# Patient Record
Sex: Male | Born: 1990 | Race: Black or African American | Hispanic: No | Marital: Single | Smoking: Current every day smoker
Health system: Southern US, Community
[De-identification: ages and names within clinical notes are randomized; demographics above are authoritative.]

## PROBLEM LIST (undated history)

## (undated) DIAGNOSIS — B20 Human immunodeficiency virus [HIV] disease: Secondary | ICD-10-CM

## (undated) HISTORY — DX: Human immunodeficiency virus (HIV) disease: B20

---
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
## 2010-02-19 ENCOUNTER — Ambulatory Visit: Payer: Self-pay | Admitting: Internal Medicine

## 2010-02-28 ENCOUNTER — Emergency Department (HOSPITAL_COMMUNITY)
Admission: EM | Admit: 2010-02-28 | Discharge: 2010-02-28 | Payer: Self-pay | Source: Home / Self Care | Admitting: Emergency Medicine

## 2010-03-09 ENCOUNTER — Emergency Department (HOSPITAL_COMMUNITY)
Admission: EM | Admit: 2010-03-09 | Discharge: 2010-03-09 | Payer: Self-pay | Source: Home / Self Care | Admitting: Emergency Medicine

## 2010-03-17 LAB — DIFFERENTIAL
Basophils Absolute: 0 10*3/uL (ref 0.0–0.1)
Basophils Relative: 0 % (ref 0–1)
Eosinophils Absolute: 0.1 10*3/uL (ref 0.0–0.7)
Eosinophils Relative: 1 % (ref 0–5)
Lymphocytes Relative: 24 % (ref 12–46)
Lymphs Abs: 1.9 10*3/uL (ref 0.7–4.0)
Monocytes Absolute: 0.7 10*3/uL (ref 0.1–1.0)
Monocytes Relative: 8 % (ref 3–12)
Neutro Abs: 5.5 10*3/uL (ref 1.7–7.7)
Neutrophils Relative %: 67 % (ref 43–77)

## 2010-03-17 LAB — CBC
HCT: 43.4 % (ref 39.0–52.0)
Hemoglobin: 15.4 g/dL (ref 13.0–17.0)
MCH: 30.5 pg (ref 26.0–34.0)
MCHC: 35.5 g/dL (ref 30.0–36.0)
MCV: 85.9 fL (ref 78.0–100.0)
Platelets: 297 10*3/uL (ref 150–400)
RBC: 5.05 MIL/uL (ref 4.22–5.81)
RDW: 12.2 % (ref 11.5–15.5)
WBC: 8.3 10*3/uL (ref 4.0–10.5)

## 2010-03-17 LAB — URINALYSIS, ROUTINE W REFLEX MICROSCOPIC
Bilirubin Urine: NEGATIVE
Ketones, ur: NEGATIVE mg/dL
Leukocytes, UA: NEGATIVE
Nitrite: NEGATIVE
Protein, ur: NEGATIVE mg/dL
Specific Gravity, Urine: 1.018 (ref 1.005–1.030)
Urine Glucose, Fasting: NEGATIVE mg/dL
Urobilinogen, UA: 1 mg/dL (ref 0.0–1.0)
pH: 7 (ref 5.0–8.0)

## 2010-03-17 LAB — COMPREHENSIVE METABOLIC PANEL
ALT: 14 U/L (ref 0–53)
AST: 19 U/L (ref 0–37)
Albumin: 4.3 g/dL (ref 3.5–5.2)
Alkaline Phosphatase: 55 U/L (ref 39–117)
BUN: 6 mg/dL (ref 6–23)
CO2: 27 mEq/L (ref 19–32)
Calcium: 9.4 mg/dL (ref 8.4–10.5)
Chloride: 102 mEq/L (ref 96–112)
Creatinine, Ser: 1 mg/dL (ref 0.4–1.5)
GFR calc Af Amer: >60 mL/min (ref >60)
GFR calc non Af Amer: >60 mL/min (ref >60)
Glucose, Bld: 112 mg/dL — ABNORMAL HIGH (ref 70–99)
Potassium: 3.5 mEq/L (ref 3.5–5.1)
Sodium: 137 mEq/L (ref 135–145)
Total Bilirubin: 0.8 mg/dL (ref 0.3–1.2)
Total Protein: 7.2 g/dL (ref 6.0–8.3)

## 2010-03-17 LAB — URINE MICROSCOPIC-ADD ON

## 2010-03-17 LAB — OCCULT BLOOD, POC DEVICE: Fecal Occult Bld: NEGATIVE

## 2010-03-17 LAB — LIPASE, BLOOD: Lipase: 20 U/L (ref 11–59)

## 2010-05-04 ENCOUNTER — Emergency Department (HOSPITAL_COMMUNITY)
Admission: EM | Admit: 2010-05-04 | Discharge: 2010-05-04 | Disposition: A | Discharge status: Home / Self Care | Payer: 59 | Attending: Emergency Medicine | Admitting: Emergency Medicine

## 2010-05-04 DIAGNOSIS — G479 Sleep disorder, unspecified: Secondary | ICD-10-CM | POA: Insufficient documentation

## 2010-05-04 DIAGNOSIS — Z711 Person with feared health complaint in whom no diagnosis is made: Secondary | ICD-10-CM | POA: Insufficient documentation

## 2010-05-04 DIAGNOSIS — F17200 Nicotine dependence, unspecified, uncomplicated: Secondary | ICD-10-CM | POA: Insufficient documentation

## 2010-05-18 LAB — DIFFERENTIAL
Basophils Relative: 1 % (ref 0–1)
Eosinophils Absolute: 0.1 10*3/uL (ref 0.0–0.7)
Eosinophils Relative: 2 % (ref 0–5)
Lymphs Abs: 1.9 10*3/uL (ref 0.7–4.0)
Monocytes Absolute: 0.4 10*3/uL (ref 0.1–1.0)
Monocytes Relative: 5 % (ref 3–12)

## 2010-05-18 LAB — COMPREHENSIVE METABOLIC PANEL
ALT: 22 U/L (ref 0–53)
AST: 26 U/L (ref 0–37)
Albumin: 4.6 g/dL (ref 3.5–5.2)
Alkaline Phosphatase: 60 U/L (ref 39–117)
BUN: 6 mg/dL (ref 6–23)
CO2: 26 mEq/L (ref 19–32)
Calcium: 9.6 mg/dL (ref 8.4–10.5)
Chloride: 99 mEq/L (ref 96–112)
Creatinine, Ser: 0.97 mg/dL (ref 0.4–1.5)
GFR calc Af Amer: >60 mL/min (ref >60)
GFR calc non Af Amer: >60 mL/min (ref >60)
Glucose, Bld: 107 mg/dL — ABNORMAL HIGH (ref 70–99)
Potassium: 4.4 mEq/L (ref 3.5–5.1)
Sodium: 134 mEq/L — ABNORMAL LOW (ref 135–145)
Total Bilirubin: 1.1 mg/dL (ref 0.3–1.2)
Total Protein: 7.4 g/dL (ref 6.0–8.3)

## 2010-05-18 LAB — CBC
HCT: 45.1 % (ref 39.0–52.0)
Hemoglobin: 14.6 g/dL (ref 13.0–17.0)
MCHC: 32.5 g/dL (ref 30.0–36.0)
MCV: 78.1 fL (ref 78.0–100.0)
Platelets: 364 10*3/uL (ref 150–400)
RBC: 5.78 MIL/uL (ref 4.22–5.81)
RDW: 16.1 % — ABNORMAL HIGH (ref 11.5–15.5)
WBC: 7.7 10*3/uL (ref 4.0–10.5)

## 2010-11-16 ENCOUNTER — Emergency Department (HOSPITAL_COMMUNITY)
Admission: EM | Admit: 2010-11-16 | Discharge: 2010-11-16 | Disposition: A | Discharge status: Home / Self Care | Payer: Self-pay | Attending: Emergency Medicine | Admitting: Emergency Medicine

## 2010-11-16 ENCOUNTER — Emergency Department (HOSPITAL_COMMUNITY): Payer: Self-pay

## 2010-11-16 DIAGNOSIS — K5900 Constipation, unspecified: Secondary | ICD-10-CM | POA: Insufficient documentation

## 2010-11-16 DIAGNOSIS — Z87891 Personal history of nicotine dependence: Secondary | ICD-10-CM | POA: Insufficient documentation

## 2010-11-16 DIAGNOSIS — K644 Residual hemorrhoidal skin tags: Secondary | ICD-10-CM | POA: Insufficient documentation

## 2010-12-02 LAB — CBC
HCT: 31.8 — ABNORMAL LOW
Hemoglobin: 10.5 — ABNORMAL LOW
MCV: 88.2
RDW: 14.3

## 2010-12-02 LAB — BASIC METABOLIC PANEL
Chloride: 105
Glucose, Bld: 88
Potassium: 4
Sodium: 138

## 2010-12-02 LAB — DIFFERENTIAL
Basophils Absolute: 0.1
Eosinophils Relative: 3
Lymphocytes Relative: 43
Lymphs Abs: 3
Monocytes Absolute: 0.4

## 2010-12-02 LAB — URINALYSIS, ROUTINE W REFLEX MICROSCOPIC
Bilirubin Urine: NEGATIVE
Glucose, UA: NEGATIVE
Ketones, ur: NEGATIVE
Leukocytes, UA: NEGATIVE
pH: 7.5

## 2011-03-28 ENCOUNTER — Emergency Department (HOSPITAL_BASED_OUTPATIENT_CLINIC_OR_DEPARTMENT_OTHER)
Admission: EM | Admit: 2011-03-28 | Discharge: 2011-03-28 | Disposition: A | Discharge status: Home / Self Care | Payer: 59 | Attending: Emergency Medicine | Admitting: Emergency Medicine

## 2011-03-28 ENCOUNTER — Encounter (HOSPITAL_BASED_OUTPATIENT_CLINIC_OR_DEPARTMENT_OTHER): Payer: Self-pay | Admitting: *Deleted

## 2011-03-28 DIAGNOSIS — K089 Disorder of teeth and supporting structures, unspecified: Secondary | ICD-10-CM | POA: Insufficient documentation

## 2011-03-28 DIAGNOSIS — K0889 Other specified disorders of teeth and supporting structures: Secondary | ICD-10-CM

## 2011-03-28 MED ORDER — NAPROXEN 500 MG PO TABS: 500.0000 mg | ORAL_TABLET | Freq: Two times a day (BID) | ORAL | Status: AC

## 2011-03-28 MED ORDER — HYDROCODONE-ACETAMINOPHEN 5-325 MG PO TABS
1.0000 | ORAL_TABLET | Freq: Once | ORAL | Status: AC
  Administered 2011-03-28: 1 via ORAL

## 2011-03-28 MED ORDER — HYDROCODONE-ACETAMINOPHEN 5-325 MG PO TABS
ORAL_TABLET | ORAL | Status: AC
  Administered 2011-03-28: 1 via ORAL
  Filled 2011-03-28: qty 1

## 2011-03-28 MED ORDER — PENICILLIN V POTASSIUM 500 MG PO TABS: 500.0000 mg | ORAL_TABLET | Freq: Four times a day (QID) | ORAL | Status: AC

## 2011-03-28 NOTE — ED Notes (Signed)
Pt presents to ED today with c/o dental pain and swelling r/t wisdom teeth extraction.  Pt states called dentist and was advised to come to ER.

## 2011-03-28 NOTE — ED Provider Notes (Signed)
History     CSN: [id number]  Arrival date & time 03/28/11  0234   First MD Initiated Contact with Patient 03/28/11 0244      Chief Complaint  Patient presents with  . Dental Pain    (Consider location/radiation/quality/duration/timing/severity/associated sxs/prior treatment) HPI  patient states he has been having pain at the site of his wisdom tooth extraction that was performed approximately a week ago. Patient states he is sore bilaterally. He has not had any fevers, vomiting. Discomfort increases with chewing. It is moderate. He called his dentist today and was told to come to emergency room for possible antibiotics and medications for pain. History reviewed. No pertinent past medical history.  History reviewed. No pertinent past surgical history.  History reviewed. No pertinent family history.  History  Substance Use Topics  . Smoking status: Not on file  . Smokeless tobacco: Not on file  . Alcohol Use: Not on file      Review of Systems  All other systems reviewed and are negative.    Allergies  Review of patient's allergies indicates no known allergies.  Home Medications   Current Outpatient Rx  Name Route Sig Dispense Refill  . NAPROXEN 500 MG PO TABS Oral Take 1 tablet (500 mg total) by mouth 2 (two) times daily with a meal. As needed for pain 20 tablet 0  . PENICILLIN V POTASSIUM 500 MG PO TABS Oral Take 1 tablet (500 mg total) by mouth 4 (four) times daily. 40 tablet 0    BP 144/93  Pulse 68  Temp 97.7 F (36.5 C)  Resp 20  SpO2 98%  Physical Exam  Nursing note and vitals reviewed. Constitutional: He appears well-developed and well-nourished. No distress.  HENT:  Head: Normocephalic and atraumatic.  Right Ear: External ear normal.  Left Ear: External ear normal.       Status post bilateral lower wisdom tooth extraction, no drainage or discharge no erythema  Eyes: Conjunctivae are normal. Right eye exhibits no discharge. Left eye exhibits no  discharge. No scleral icterus.  Neck: Neck supple. No tracheal deviation present.  Cardiovascular: Normal rate.   Pulmonary/Chest: Effort normal. No stridor. No respiratory distress.  Musculoskeletal: He exhibits no edema.  Lymphadenopathy:    He has no cervical adenopathy.  Neurological: He is alert. Cranial nerve deficit: no gross deficits.  Skin: Skin is warm and dry. No rash noted.  Psychiatric: He has a normal mood and affect.    ED Course  Procedures (including critical care time)  Labs Reviewed - No data to display No results found.   1. Toothache       MDM  Patient without obvious signs of severe infection. We'll prescribe him a course of penicillin and Naprosyn and he is encouraged to follow up with his dentist this coming week.        Celene Kras, MD 03/28/11 [phone]

## 2011-08-28 ENCOUNTER — Encounter (HOSPITAL_COMMUNITY): Payer: Self-pay

## 2011-08-28 ENCOUNTER — Emergency Department (INDEPENDENT_AMBULATORY_CARE_PROVIDER_SITE_OTHER)
Admission: EM | Admit: 2011-08-28 | Discharge: 2011-08-28 | Disposition: A | Discharge status: Home / Self Care | Payer: Medicaid Other | Source: Home / Self Care | Attending: Family Medicine | Admitting: Family Medicine

## 2011-08-28 DIAGNOSIS — J020 Streptococcal pharyngitis: Secondary | ICD-10-CM

## 2011-08-28 MED ORDER — CEFDINIR 300 MG PO CAPS: 300.0000 mg | ORAL_CAPSULE | Freq: Two times a day (BID) | ORAL | Status: AC

## 2011-08-28 NOTE — ED Provider Notes (Signed)
History     CSN: [id number]  Arrival date & time 08/28/11  1520   First MD Initiated Contact with Patient 08/28/11 1546      No chief complaint on file.   (Consider location/radiation/quality/duration/timing/severity/associated sxs/prior treatment) Patient is a 21 y.o. male presenting with pharyngitis. The history is provided by the patient.  Sore Throat This is a new problem. The current episode started 2 days ago. The problem has been gradually worsening. The symptoms are aggravated by swallowing.    No past medical history on file.  No past surgical history on file.  No family history on file.  History  Substance Use Topics  . Smoking status: Not on file  . Smokeless tobacco: Not on file  . Alcohol Use: Not on file      Review of Systems  Constitutional: Negative for fever and chills.  HENT: Positive for sore throat. Negative for congestion, rhinorrhea, postnasal drip and sinus pressure.   Respiratory: Negative for cough.   Skin: Negative.     Allergies  Review of patient's allergies indicates no known allergies.  Home Medications   Current Outpatient Rx  Name Route Sig Dispense Refill  . CEFDINIR 300 MG PO CAPS Oral Take 1 capsule (300 mg total) by mouth 2 (two) times daily. 20 capsule 0  . NAPROXEN 500 MG PO TABS Oral Take 1 tablet (500 mg total) by mouth 2 (two) times daily with a meal. As needed for pain 20 tablet 0    BP 153/94  Pulse 78  Temp 98.5 F (36.9 C) (Oral)  Resp 16  SpO2 99%  Physical Exam  Nursing note and vitals reviewed. Constitutional: He is oriented to person, place, and time. He appears well-developed and well-nourished.  HENT:  Head: Normocephalic.  Right Ear: External ear normal.  Left Ear: External ear normal.  Mouth/Throat: Oropharyngeal exudate and posterior oropharyngeal erythema present.  Neck: Normal range of motion. Neck supple.  Lymphadenopathy:    He has cervical adenopathy.  Neurological: He is alert and  oriented to person, place, and time.  Skin: Skin is warm and dry. No rash noted.    ED Course  Procedures (including critical care time)   Labs Reviewed  POCT RAPID STREP A ([facility] CARE ONLY)   No results found.   1. Strep sore throat       MDM          Linna Hoff, MD 08/28/11 1556

## 2011-08-28 NOTE — ED Notes (Signed)
Swollen tonsils, sore throat; NAD

## 2011-08-28 NOTE — Discharge Instructions (Signed)
Drink lots of fluids, take all of medicine, use lozenges as needed.return if needed °

## 2013-03-13 ENCOUNTER — Emergency Department (HOSPITAL_COMMUNITY)
Admission: EM | Admit: 2013-03-13 | Discharge: 2013-03-13 | Discharge status: Absent without leave | Payer: Self-pay | Source: Home / Self Care

## 2013-03-13 ENCOUNTER — Emergency Department (HOSPITAL_COMMUNITY)
Admission: EM | Admit: 2013-03-13 | Discharge: 2013-03-13 | Discharge status: Against Medical Advice | Payer: Self-pay | Attending: Emergency Medicine | Admitting: Emergency Medicine

## 2013-03-13 ENCOUNTER — Encounter (HOSPITAL_COMMUNITY): Payer: Self-pay | Admitting: Emergency Medicine

## 2013-03-13 DIAGNOSIS — R059 Cough, unspecified: Secondary | ICD-10-CM | POA: Insufficient documentation

## 2013-03-13 DIAGNOSIS — G4730 Sleep apnea, unspecified: Secondary | ICD-10-CM | POA: Insufficient documentation

## 2013-03-13 DIAGNOSIS — J029 Acute pharyngitis, unspecified: Secondary | ICD-10-CM | POA: Insufficient documentation

## 2013-03-13 DIAGNOSIS — F17200 Nicotine dependence, unspecified, uncomplicated: Secondary | ICD-10-CM | POA: Insufficient documentation

## 2013-03-13 DIAGNOSIS — R05 Cough: Secondary | ICD-10-CM | POA: Insufficient documentation

## 2013-03-13 LAB — RAPID STREP SCREEN (MED CTR MEBANE ONLY): STREPTOCOCCUS, GROUP A SCREEN (DIRECT): NEGATIVE

## 2013-03-13 NOTE — ED Notes (Signed)
Pt is here with sore throat and reports that he is waking up coughing at night.  Pt was told to come here because they think he could have sleep apnea per family, who says he stops breathing some during the night.

## 2013-03-15 LAB — CULTURE, GROUP A STREP

## 2013-05-27 ENCOUNTER — Emergency Department (HOSPITAL_BASED_OUTPATIENT_CLINIC_OR_DEPARTMENT_OTHER)
Admission: EM | Admit: 2013-05-27 | Discharge: 2013-05-27 | Disposition: A | Discharge status: Home / Self Care | Payer: Self-pay | Attending: Emergency Medicine | Admitting: Emergency Medicine

## 2013-05-27 ENCOUNTER — Emergency Department (HOSPITAL_BASED_OUTPATIENT_CLINIC_OR_DEPARTMENT_OTHER): Payer: Self-pay

## 2013-05-27 DIAGNOSIS — R869 Unspecified abnormal finding in specimens from male genital organs: Secondary | ICD-10-CM | POA: Insufficient documentation

## 2013-05-27 DIAGNOSIS — M25519 Pain in unspecified shoulder: Secondary | ICD-10-CM | POA: Insufficient documentation

## 2013-05-27 DIAGNOSIS — F17200 Nicotine dependence, unspecified, uncomplicated: Secondary | ICD-10-CM | POA: Insufficient documentation

## 2013-05-27 LAB — HIV ANTIBODY (ROUTINE TESTING W REFLEX): HIV: REACTIVE — AB

## 2013-05-27 MED ORDER — IBUPROFEN 600 MG PO TABS: 600.0000 mg | ORAL_TABLET | Freq: Four times a day (QID) | ORAL | Status: AC | PRN

## 2013-05-27 NOTE — Discharge Instructions (Signed)
Rotator Cuff Tendinitis  Rotator cuff tendinitis is inflammation of the tough, cord-like bands that connect muscle to bone (tendons) in your rotator cuff. Your rotator cuff is the collection of all the muscles and tendons that connect your arm to your shoulder. Your rotator cuff holds the head of your upper arm bone (humerus) in the cup (fossa) of your shoulder blade (scapula). CAUSES Rotator cuff tendinitis is usually caused by overusing the joint involved.  SIGNS AND SYMPTOMS  Deep ache in the shoulder also felt on the outside upper arm over the shoulder muscle.  Point tenderness over the area that is injured.  Pain comes on gradually and becomes worse with lifting the arm to the side (abduction) or turning it inward (internal rotation).  May lead to a chronic tear: When a rotator cuff tendon becomes inflamed, it runs the risk of losing its blood supply, causing some tendon fibers to die. This increases the risk that the tendon can fray and partially or completely tear. DIAGNOSIS Rotator cuff tendinitis is diagnosed by taking a medical history, performing a physical exam, and reviewing results of imaging exams. The medical history is useful to help determine the type of rotator cuff injury. The physical exam will include looking at the injured shoulder, feeling the injured area, and watching you do range-of-motion exercises. X-ray exams are typically done to rule out other causes of shoulder pain, such as fractures. MRI is the imaging exam usually used for significant shoulder injuries. Sometimes a dye study called CT arthrogram is done, but it is not as widely used as MRI. In some institutions, special ultrasound tests may also be used to aid in the diagnosis. TREATMENT  Less Severe Cases  Use of a sling to rest the shoulder for a short period of time. Prolonged use of the sling can cause stiffness, weakness, and loss of motion of the shoulder joint.  Anti-inflammatory medicines, such as  ibuprofen or naproxen sodium, may be prescribed. More Severe Cases  Physical therapy.  Use of steroid injections into the shoulder joint.  Surgery. HOME CARE INSTRUCTIONS   Use a sling or splint until the pain decreases. Prolonged use of the sling can cause stiffness, weakness, and loss of motion of the shoulder joint.  Apply ice to the injured area:  Put ice in a plastic bag.  Place a towel between your skin and the bag.  Leave the ice on for 20 minutes, 2 3 times a day.  Try to avoid use other than gentle range of motion while your shoulder is painful. Use the shoulder and exercise only as directed by your health care provider. Stop exercises or range of motion if pain or discomfort increases, unless directed otherwise by your health care provider.  Only take over-the-counter or prescription medicines for pain, discomfort, or fever as directed by your health care provider.  If you were given a shoulder sling and straps (immobilizer), do not remove it except as directed, or until you see a health care provider for a follow-up exam. If you need to remove it, move your arm as little as possible or as directed.  You may want to sleep on several pillows at night to lessen swelling and pain. SEEK IMMEDIATE MEDICAL CARE IF:   Your shoulder pain increases or new pain develops in your arm, hand, or fingers and is not relieved with medicines.  You have new, unexplained symptoms, especially increased numbness in the hands or loss of strength.  You develop any worsening of the   problems that brought you in for care.  Your arm, hand, or fingers are numb or tingling.  Your arm, hand, or fingers are swollen, painful, or turn white or blue. MAKE SURE YOU:  Understand these instructions.  Will watch your condition.  Will get help right away if you are not doing well or get worse. Document Released: 05/09/2003 Document Revised: 12/07/2012 Document Reviewed: 09/28/2012 Exit[employer] Patient  Information 2014 [employer].  

## 2013-05-27 NOTE — ED Notes (Signed)
Pt. Reports he has L shoulder pain since Jan. 2015 and also reports "My semen is yellow"  No c/o testicular pain or penile pain.  No injury to the L shoulder per Pt.

## 2013-05-27 NOTE — ED Provider Notes (Signed)
CSN: [id number]     Arrival date & time 05/27/13  0004 History   First MD Initiated Contact with Patient 05/27/13 0041     Chief Complaint  Patient presents with  . Shoulder Pain     (Consider location/radiation/quality/duration/timing/severity/associated sxs/prior Treatment) HPI Comments: Pt comes in with cc of left shoulder pain and "yellow semen." Shoulder pain started in Jan. Daily, constant mild pain - worse with certain activities. He moved from [place] in Jan. Pt also complains of dark yellow semen the last few days, with may be some red stuff mixed in, which he thinks is blood. He has no UTI like sx, no trauma, no rash, no scrotal pain, and no yellow discharge. Pt is having male to male intercourse, with 1 partner, unprotected, denies any hx of STD.  Patient is a 23 y.o. male presenting with shoulder pain. The history is provided by the patient.  Shoulder Pain Pertinent negatives include no chest pain and no abdominal pain.    No past medical history on file. No past surgical history on file. No family history on file. History  Substance Use Topics  . Smoking status: Current Every Day Smoker  . Smokeless tobacco: Not on file  . Alcohol Use: Yes    Review of Systems  Cardiovascular: Negative for chest pain.  Gastrointestinal: Negative for nausea, vomiting and abdominal pain.  Genitourinary: Negative for dysuria, flank pain, discharge, penile swelling, scrotal swelling, penile pain and testicular pain.  Musculoskeletal: Positive for arthralgias.  Skin: Negative for rash.      Allergies  Review of patient's allergies indicates no known allergies.  Home Medications   Current Outpatient Rx  Name  Route  Sig  Dispense  Refill  . ibuprofen (ADVIL,MOTRIN) 600 MG tablet   Oral   Take 1 tablet (600 mg total) by mouth every 6 (six) hours as needed.   30 tablet   0    BP 141/98  Pulse 79  Temp(Src) 98.9 F (37.2 C) (Oral)  Resp 17  Ht 5\' 10"  (1.778 m)  Wt 148 lb  (67.132 kg)  BMI 21.24 kg/m2 Physical Exam  Nursing note and vitals reviewed. Constitutional: He is oriented to person, place, and time. He appears well-developed.  HENT:  Head: Atraumatic.  Eyes: Conjunctivae are normal.  Neck: Neck supple.  Pulmonary/Chest: Effort normal.  Musculoskeletal:  Pt has tenderness with abduction (at 60 degrees) of the shoulder, and with internal rotation of the shoulder. No joint swelling appreciated.   Neurological: He is alert and oriented to person, place, and time.    ED Course  Procedures (including critical care time) Labs Review Labs Reviewed  GC/CHLAMYDIA PROBE AMP  HIV ANTIBODY (ROUTINE TESTING)   Imaging Review Dg Shoulder Left  05/27/2013   CLINICAL DATA:  Evaluate for rotator cuff tendinitis  EXAM: LEFT SHOULDER - 2+ VIEW  COMPARISON:  10/02/2009  FINDINGS: There is no evidence of fracture or dislocation. There is no evidence of arthropathy or other focal bone abnormality. No evidence of calcific tendinitis.  IMPRESSION: Negative.   Electronically Signed   By: Tiburcio Pea M.D.   On: 05/27/2013 01:31     EKG Interpretation None      MDM   Final diagnoses:  Shoulder pain    Pt comes in with 2 complains.  Shoulder pain x months. ? Rotator cuff injury. Will give Sports medicine f/u. Also has abnormal color of semen. No discharge, no hard signs of STD. Will get urine GC and Chlamydia testing,  and patient informed to see PCP for this complains if the sx persists.   Derwood KaplanAnkit Aaiden Depoy, MD 05/27/13 [phone]

## 2013-05-29 ENCOUNTER — Telehealth: Payer: Self-pay | Admitting: Infectious Diseases

## 2013-05-29 ENCOUNTER — Telehealth (HOSPITAL_BASED_OUTPATIENT_CLINIC_OR_DEPARTMENT_OTHER): Payer: Self-pay | Admitting: Emergency Medicine

## 2013-05-29 LAB — GC/CHLAMYDIA PROBE AMP
CT Probe RNA: POSITIVE — AB
GC Probe RNA: NEGATIVE

## 2013-05-29 NOTE — Telephone Encounter (Signed)
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 I called and left message for pt to call back to clinic (did not disclose why).  I have also turned his MR and name over to the DIS.

## 2013-05-30 ENCOUNTER — Encounter: Payer: Self-pay | Admitting: *Deleted

## 2013-05-30 ENCOUNTER — Ambulatory Visit (INDEPENDENT_AMBULATORY_CARE_PROVIDER_SITE_OTHER): Payer: Self-pay

## 2013-05-30 DIAGNOSIS — Z79899 Other long term (current) drug therapy: Secondary | ICD-10-CM

## 2013-05-30 DIAGNOSIS — Z23 Encounter for immunization: Secondary | ICD-10-CM

## 2013-05-30 DIAGNOSIS — B20 Human immunodeficiency virus [HIV] disease: Secondary | ICD-10-CM

## 2013-05-30 DIAGNOSIS — Z113 Encounter for screening for infections with a predominantly sexual mode of transmission: Secondary | ICD-10-CM

## 2013-05-30 DIAGNOSIS — A749 Chlamydial infection, unspecified: Secondary | ICD-10-CM

## 2013-05-30 LAB — CBC WITH DIFFERENTIAL/PLATELET
BASOS ABS: 0 10*3/uL (ref 0.0–0.1)
Basophils Relative: 0 % (ref 0–1)
EOS PCT: 1 % (ref 0–5)
Eosinophils Absolute: 0.1 10*3/uL (ref 0.0–0.7)
HEMATOCRIT: 48.2 % (ref 39.0–52.0)
HEMOGLOBIN: 16.9 g/dL (ref 13.0–17.0)
LYMPHS ABS: 1.7 10*3/uL (ref 0.7–4.0)
LYMPHS PCT: 26 % (ref 12–46)
MCH: 29.9 pg (ref 26.0–34.0)
MCHC: 35.1 g/dL (ref 30.0–36.0)
MCV: 85.3 fL (ref 78.0–100.0)
MONO ABS: 0.5 10*3/uL (ref 0.1–1.0)
MONOS PCT: 7 % (ref 3–12)
NEUTROS ABS: 4.3 10*3/uL (ref 1.7–7.7)
Neutrophils Relative %: 66 % (ref 43–77)
Platelets: 335 10*3/uL (ref 150–400)
RBC: 5.65 MIL/uL (ref 4.22–5.81)
RDW: 13.3 % (ref 11.5–15.5)
WBC: 6.5 10*3/uL (ref 4.0–10.5)

## 2013-05-30 LAB — URINALYSIS
Glucose, UA: NEGATIVE mg/dL
NITRITE: NEGATIVE
PH: 7 (ref 5.0–8.0)
Protein, ur: 30 mg/dL — AB
SPECIFIC GRAVITY, URINE: 1.022 (ref 1.005–1.030)
UROBILINOGEN UA: 4 mg/dL — AB (ref 0.0–1.0)

## 2013-05-30 LAB — COMPLETE METABOLIC PANEL WITH GFR
ALBUMIN: 4.8 g/dL (ref 3.5–5.2)
ALT: 19 U/L (ref 0–53)
AST: 21 U/L (ref 0–37)
Alkaline Phosphatase: 58 U/L (ref 39–117)
BUN: 9 mg/dL (ref 6–23)
CALCIUM: 10 mg/dL (ref 8.4–10.5)
CHLORIDE: 100 meq/L (ref 96–112)
CO2: 26 meq/L (ref 19–32)
CREATININE: 0.95 mg/dL (ref 0.50–1.35)
GFR, Est Non African American: >89 mL/min
GLUCOSE: 126 mg/dL — AB (ref 70–99)
POTASSIUM: 3.8 meq/L (ref 3.5–5.3)
Sodium: 136 mEq/L (ref 135–145)
Total Bilirubin: 0.8 mg/dL (ref 0.2–1.2)
Total Protein: 8.3 g/dL (ref 6.0–8.3)

## 2013-05-30 LAB — LIPID PANEL
CHOLESTEROL: 111 mg/dL (ref 0–200)
HDL: 41 mg/dL (ref >39)
LDL CALC: 59 mg/dL (ref 0–99)
TRIGLYCERIDES: 53 mg/dL (ref <150)
Total CHOL/HDL Ratio: 2.7 Ratio
VLDL: 11 mg/dL (ref 0–40)

## 2013-05-30 LAB — RPR

## 2013-05-30 MED ORDER — AZITHROMYCIN 250 MG PO TABS: ORAL_TABLET | ORAL | Status: DC

## 2013-05-30 NOTE — Progress Notes (Signed)
Patient here today for reactive HIV antibody test and positive chlamydia test performed during visit on 05-27-13 at [facility] emergency room  He will be treated today for chlamydia  with Azithromycin 1 gram.  Test was repeated at today's visit in error.    He is accompanied by his Aunt today who is part of his support system.  He is very anxious and feels his life is over since receiving the test results on 05-29-13. He is not able to process this fully at this time . He has multiple questions about HIV cures and not treatment.  He has been on the Internet and thinks evidence exist that HIV can be cured.  He does not feel the test performed at the Emergency room is accurate and I assured him we will confirm testing with today's labs and most likely this is a positive HIV test.  Patient has at least 6  tattoos most done at tattoo parties.  He has bilateral nipple, left eyebrow, lip and tongue piercings.  Vaccines updated. No records to request.   Laurell Josephsammy K Ines Rebel, RN

## 2013-05-31 LAB — HIV-1 RNA ULTRAQUANT REFLEX TO GENTYP+
HIV 1 RNA QUANT: 23855 {copies}/mL — AB (ref <20)
HIV-1 RNA QUANT, LOG: 4.38 {Log} — AB (ref <1.30)

## 2013-05-31 LAB — URINE CYTOLOGY ANCILLARY ONLY
Chlamydia: POSITIVE — AB
Neisseria Gonorrhea: NEGATIVE

## 2013-05-31 LAB — HEPATITIS C ANTIBODY: HCV AB: NEGATIVE

## 2013-05-31 LAB — HEPATITIS B CORE ANTIBODY, TOTAL: Hep B Core Total Ab: NONREACTIVE

## 2013-05-31 LAB — HEPATITIS A ANTIBODY, TOTAL: HEP A TOTAL AB: NONREACTIVE

## 2013-05-31 LAB — HEPATITIS B SURFACE ANTIBODY,QUALITATIVE

## 2013-05-31 LAB — T-HELPER CELL (CD4) - (RCID CLINIC ONLY)
CD4 % Helper T Cell: 32 % — ABNORMAL LOW (ref 33–55)
CD4 T Cell Abs: 540 /uL (ref 400–2700)

## 2013-05-31 LAB — HEPATITIS B SURFACE ANTIGEN: HEP B S AG: NEGATIVE

## 2013-06-01 LAB — TB SKIN TEST
INDURATION: 0 mm
TB SKIN TEST: NEGATIVE

## 2013-06-05 DIAGNOSIS — B20 Human immunodeficiency virus [HIV] disease: Secondary | ICD-10-CM | POA: Insufficient documentation

## 2013-06-05 DIAGNOSIS — A749 Chlamydial infection, unspecified: Secondary | ICD-10-CM | POA: Insufficient documentation

## 2013-06-05 MED ORDER — AZITHROMYCIN 250 MG PO TABS: ORAL_TABLET | ORAL | Status: DC

## 2013-06-08 LAB — HIV 1/2 CONFIRMATION
HIV 1 ANTIBODY: POSITIVE
HIV-2 Ab: NEGATIVE

## 2013-06-09 LAB — HLA B*5701: HLA-B 5701 W/RFLX HLA-B HIGH: NEGATIVE

## 2013-06-12 LAB — HIV-1 GENOTYPR PLUS

## 2013-06-15 ENCOUNTER — Ambulatory Visit (INDEPENDENT_AMBULATORY_CARE_PROVIDER_SITE_OTHER): Payer: Self-pay | Admitting: Infectious Diseases

## 2013-06-15 ENCOUNTER — Encounter: Payer: Self-pay | Admitting: Infectious Diseases

## 2013-06-15 VITALS — BP 150/105 | HR 85 | Temp 97.7°F | Wt 170.0 lb

## 2013-06-15 DIAGNOSIS — A749 Chlamydial infection, unspecified: Secondary | ICD-10-CM

## 2013-06-15 DIAGNOSIS — B20 Human immunodeficiency virus [HIV] disease: Secondary | ICD-10-CM

## 2013-06-15 MED ORDER — EMTRICITAB-RILPIVIR-TENOFOV DF 200-25-300 MG PO TABS: 1.0000 | ORAL_TABLET | Freq: Every day | ORAL | Status: DC

## 2013-06-15 NOTE — Assessment & Plan Note (Signed)
He was treated with azithro at his intake.

## 2013-06-15 NOTE — Assessment & Plan Note (Addendum)
He has an intact immune system and a relatively low VL. Will get him started on complera, write rx and give it to [facility] counselor. Will see him back in 1-2 months. He is offered/refused condoms. Questions are answered for him and his mother, grandmother.

## 2013-06-15 NOTE — Progress Notes (Signed)
  [facility] - Pharmacist    HPI: Christian Berry is a 23 y.o. male presents with recent diagnosis of HIV in March.  Allergies: No Known Allergies  Vitals: Temp: 97.7 F (36.5 C) (04/16 1525) Temp src: Oral (04/16 1525) BP: 150/105 mmHg (04/16 1525) Pulse Rate: 85 (04/16 1525)  Past Medical History: Past Medical History  Diagnosis Date  . HIV disease     Social History: History   Social History  . Marital Status: Single    Spouse Name: N/A    Number of Children: N/A  . Years of Education: N/A   Social History Main Topics  . Smoking status: Current Every Day Smoker -- 0.40 packs/day for 7 years    Types: Cigarettes  . Smokeless tobacco: Never Used  . Alcohol Use: 0.0 oz/week     Comment: once monthly, social   . Drug Use: No  . Sexual Activity: Yes    Partners: Male    Birth Control/ Protection: Condom   Other Topics Concern  . None   Social History Narrative  . None    Labs: HIV 1 RNA Quant (copies/mL)  Date Value  05/30/2013 [id number]*     CD4 T Cell Abs (/uL)  Date Value  05/30/2013 540      Hep B S Ab (no units)  Date Value  05/30/2013 INDETER*     Hepatitis B Surface Ag (no units)  Date Value  05/30/2013 NEGATIVE      HCV Ab (no units)  Date Value  05/30/2013 NEGATIVE     CrCl: The CrCl is unknown because both a height and weight (above a minimum accepted value) are required for this calculation.  Lipids:    Component Value Date/Time   CHOL 111 05/30/2013 1537   TRIG 53 05/30/2013 1537   HDL 41 05/30/2013 1537   CHOLHDL 2.7 05/30/2013 1537   VLDL 11 05/30/2013 1537   LDLCALC 59 05/30/2013 1537    Assessment: 23 yo M presents today with uncontrolled HIV. Patient is newly diagnosed and otherwise healthy. Patient will start on complera once daily.   Recommendations: I counseled the importance of adherence to the patient and will follow up on compliance with next visit.  Dolphus Jennyadha M [place] Perra, Pharm.D. PGY1 Clinical  Pharmacy Resident [facility] 06/15/2013, 4:03 PM

## 2013-06-15 NOTE — Progress Notes (Signed)
   Subjective:    Patient ID: Christian Berry, male    DOB: [birth year], 23 y.o.   MRN: [id number]  HPI  23 yo M here after dx with HIV+ after STD visit to Ed March 2015. His test at that time was also + for chlamydia.  Has not felt ill at all.   HIV 1 RNA Quant (copies/mL)  Date Value  05/30/2013 [id number]*     CD4 T Cell Abs (/uL)  Date Value  05/30/2013 540     Review of Systems  Constitutional: Negative for fever, chills, appetite change and unexpected weight change.  HENT: Negative for mouth sores and trouble swallowing.   Respiratory: Negative for cough and shortness of breath.   Gastrointestinal: Negative for diarrhea and constipation.  Genitourinary: Negative for difficulty urinating.  Hematological: Negative for adenopathy.       Objective:   Physical Exam  Constitutional: He appears well-developed and well-nourished.  HENT:  Mouth/Throat: No oropharyngeal exudate.  Eyes: EOM are normal. Pupils are equal, round, and reactive to light.  Neck: Neck supple.  Cardiovascular: Normal rate, regular rhythm and normal heart sounds.   Pulmonary/Chest: Effort normal and breath sounds normal.  Abdominal: Soft. Bowel sounds are normal. There is no tenderness.  Musculoskeletal: He exhibits no edema.  Lymphadenopathy:    He has no cervical adenopathy.          Assessment & Plan:

## 2013-07-04 ENCOUNTER — Other Ambulatory Visit: Payer: Self-pay

## 2013-07-04 DIAGNOSIS — B20 Human immunodeficiency virus [HIV] disease: Secondary | ICD-10-CM

## 2013-07-04 MED ORDER — EMTRICITAB-RILPIVIR-TENOFOV DF 200-25-300 MG PO TABS: 1.0000 | ORAL_TABLET | Freq: Every day | ORAL | Status: DC

## 2013-07-04 NOTE — Telephone Encounter (Signed)
Patient calling for medications.  He has received authorization from [facility] with approval code of [id number]. [facility] [place] [facility] [place] .

## 2013-07-06 ENCOUNTER — Other Ambulatory Visit: Payer: Self-pay

## 2013-07-06 DIAGNOSIS — B20 Human immunodeficiency virus [HIV] disease: Secondary | ICD-10-CM

## 2013-07-06 MED ORDER — EMTRICITAB-RILPIVIR-TENOFOV DF 200-25-300 MG PO TABS: 1.0000 | ORAL_TABLET | Freq: Every day | ORAL | Status: DC

## 2013-07-07 ENCOUNTER — Other Ambulatory Visit: Payer: Self-pay | Admitting: *Deleted

## 2013-07-07 DIAGNOSIS — B20 Human immunodeficiency virus [HIV] disease: Secondary | ICD-10-CM

## 2013-07-07 MED ORDER — EMTRICITAB-RILPIVIR-TENOFOV DF 200-25-300 MG PO TABS: 1.0000 | ORAL_TABLET | Freq: Every day | ORAL | Status: DC

## 2013-07-31 ENCOUNTER — Other Ambulatory Visit: Payer: Self-pay

## 2013-08-08 ENCOUNTER — Other Ambulatory Visit (INDEPENDENT_AMBULATORY_CARE_PROVIDER_SITE_OTHER): Payer: Self-pay

## 2013-08-08 DIAGNOSIS — B20 Human immunodeficiency virus [HIV] disease: Secondary | ICD-10-CM

## 2013-08-08 LAB — CBC WITH DIFFERENTIAL/PLATELET
BASOS ABS: 0 10*3/uL (ref 0.0–0.1)
Basophils Relative: 0 % (ref 0–1)
EOS ABS: 0.3 10*3/uL (ref 0.0–0.7)
Eosinophils Relative: 8 % — ABNORMAL HIGH (ref 0–5)
HEMATOCRIT: 43.1 % (ref 39.0–52.0)
Hemoglobin: 15.2 g/dL (ref 13.0–17.0)
Lymphocytes Relative: 39 % (ref 12–46)
Lymphs Abs: 1.5 10*3/uL (ref 0.7–4.0)
MCH: 30 pg (ref 26.0–34.0)
MCHC: 35.3 g/dL (ref 30.0–36.0)
MCV: 85 fL (ref 78.0–100.0)
MONO ABS: 0.3 10*3/uL (ref 0.1–1.0)
Monocytes Relative: 9 % (ref 3–12)
Neutro Abs: 1.7 10*3/uL (ref 1.7–7.7)
Neutrophils Relative %: 44 % (ref 43–77)
PLATELETS: 255 10*3/uL (ref 150–400)
RBC: 5.07 MIL/uL (ref 4.22–5.81)
RDW: 13.6 % (ref 11.5–15.5)
WBC: 3.8 10*3/uL — ABNORMAL LOW (ref 4.0–10.5)

## 2013-08-09 LAB — COMPREHENSIVE METABOLIC PANEL
ALT: 22 U/L (ref 0–53)
AST: 24 U/L (ref 0–37)
Albumin: 4.4 g/dL (ref 3.5–5.2)
Alkaline Phosphatase: 43 U/L (ref 39–117)
BILIRUBIN TOTAL: 0.6 mg/dL (ref 0.2–1.2)
BUN: 15 mg/dL (ref 6–23)
CALCIUM: 9.4 mg/dL (ref 8.4–10.5)
CO2: 26 mEq/L (ref 19–32)
Chloride: 103 mEq/L (ref 96–112)
Creat: 1.41 mg/dL — ABNORMAL HIGH (ref 0.50–1.35)
Glucose, Bld: 95 mg/dL (ref 70–99)
Potassium: 4.4 mEq/L (ref 3.5–5.3)
Sodium: 139 mEq/L (ref 135–145)
Total Protein: 7.5 g/dL (ref 6.0–8.3)

## 2013-08-09 LAB — T-HELPER CELL (CD4) - (RCID CLINIC ONLY)
CD4 T CELL HELPER: 39 % (ref 33–55)
CD4 T Cell Abs: 630 /uL (ref 400–2700)

## 2013-08-11 LAB — HIV-1 RNA QUANT-NO REFLEX-BLD
HIV 1 RNA QUANT: 65 {copies}/mL — AB (ref <20)
HIV-1 RNA Quant, Log: 1.81 {Log} — ABNORMAL HIGH (ref <1.30)

## 2013-08-14 ENCOUNTER — Ambulatory Visit (INDEPENDENT_AMBULATORY_CARE_PROVIDER_SITE_OTHER): Payer: Self-pay | Admitting: Infectious Diseases

## 2013-08-14 ENCOUNTER — Encounter: Payer: Self-pay | Admitting: Infectious Diseases

## 2013-08-14 VITALS — BP 142/90 | HR 93 | Temp 99.0°F | Wt 172.0 lb

## 2013-08-14 DIAGNOSIS — J302 Other seasonal allergic rhinitis: Secondary | ICD-10-CM

## 2013-08-14 DIAGNOSIS — B20 Human immunodeficiency virus [HIV] disease: Secondary | ICD-10-CM

## 2013-08-14 DIAGNOSIS — J309 Allergic rhinitis, unspecified: Secondary | ICD-10-CM

## 2013-08-14 DIAGNOSIS — Z23 Encounter for immunization: Secondary | ICD-10-CM

## 2013-08-14 DIAGNOSIS — IMO0002 Reserved for concepts with insufficient information to code with codable children: Secondary | ICD-10-CM

## 2013-08-14 DIAGNOSIS — S46912A Strain of unspecified muscle, fascia and tendon at shoulder and upper arm level, left arm, initial encounter: Secondary | ICD-10-CM | POA: Insufficient documentation

## 2013-08-14 LAB — BASIC METABOLIC PANEL
BUN: 17 mg/dL (ref 6–23)
CALCIUM: 10.2 mg/dL (ref 8.4–10.5)
CO2: 28 meq/L (ref 19–32)
CREATININE: 1.05 mg/dL (ref 0.50–1.35)
Chloride: 100 mEq/L (ref 96–112)
Glucose, Bld: 94 mg/dL (ref 70–99)
Potassium: 4.5 mEq/L (ref 3.5–5.3)
SODIUM: 136 meq/L (ref 135–145)

## 2013-08-14 NOTE — Assessment & Plan Note (Signed)
He is doing very well. Will start Hep A series. He is given condoms. Will see him back in 4-6 months.  His Cr was increased at f/u lab, will recheck.

## 2013-08-14 NOTE — Addendum Note (Signed)
Addended by: Wendall MolaOCKERHAM, JACQUELINE A on: 08/14/2013 04:53 PM   Modules accepted: Orders

## 2013-08-14 NOTE — Addendum Note (Signed)
Addended by: HATCHER, JEFFREY C on: 08/14/2013 04:19 PM   Modules accepted: Orders, Medications

## 2013-08-14 NOTE — Assessment & Plan Note (Signed)
Advised him to use heat, NSAIDS

## 2013-08-14 NOTE — Assessment & Plan Note (Signed)
Advised him to use otc anti-histamine, flonase.

## 2013-08-14 NOTE — Progress Notes (Signed)
   Subjective:    Patient ID: Christian Berry, male    DOB: 10/25/1990, 23 y.o.   MRN: [id number]  HPI 23 yo M here after dx with HIV+ after STD/chlamydia visit to ED March 2015. Having night time cough. Has difficulty breathing with this, right before sleep. Has sinus d/c, has had "bad' allergies. Has noted no side effects from complera.   HIV 1 RNA Quant (copies/mL)  Date Value  08/08/2013 65*  05/30/2013 [id number]*     CD4 T Cell Abs (/uL)  Date Value  08/08/2013 630   05/30/2013 540     C/o L shoulder pain. Over scapula. Started in January after sleeping on sisters floor. Had prev taken ibuprofen, not helping. Has tried heat in shower.   Review of Systems  Constitutional: Negative for appetite change and unexpected weight change.  Gastrointestinal: Negative for diarrhea and constipation.  Genitourinary: Negative for difficulty urinating.  Musculoskeletal: Positive for arthralgias.       Objective:   Physical Exam  Constitutional: He appears well-developed and well-nourished.  HENT:  Mouth/Throat: No oropharyngeal exudate.  Eyes: EOM are normal. Pupils are equal, round, and reactive to light.  Neck: Neck supple.  Cardiovascular: Normal rate, regular rhythm and normal heart sounds.   Pulmonary/Chest: Effort normal and breath sounds normal.  Abdominal: Soft. Bowel sounds are normal. He exhibits no distension. There is no tenderness.  Lymphadenopathy:    He has no cervical adenopathy.          Assessment & Plan:

## 2013-08-20 ENCOUNTER — Encounter (HOSPITAL_BASED_OUTPATIENT_CLINIC_OR_DEPARTMENT_OTHER): Payer: Self-pay | Admitting: Emergency Medicine

## 2013-08-20 ENCOUNTER — Emergency Department (HOSPITAL_BASED_OUTPATIENT_CLINIC_OR_DEPARTMENT_OTHER)
Admission: EM | Admit: 2013-08-20 | Discharge: 2013-08-20 | Disposition: A | Discharge status: Home / Self Care | Payer: Self-pay | Attending: Emergency Medicine | Admitting: Emergency Medicine

## 2013-08-20 DIAGNOSIS — L089 Local infection of the skin and subcutaneous tissue, unspecified: Secondary | ICD-10-CM

## 2013-08-20 DIAGNOSIS — L03319 Cellulitis of trunk, unspecified: Principal | ICD-10-CM

## 2013-08-20 DIAGNOSIS — L02219 Cutaneous abscess of trunk, unspecified: Secondary | ICD-10-CM | POA: Insufficient documentation

## 2013-08-20 DIAGNOSIS — Z21 Asymptomatic human immunodeficiency virus [HIV] infection status: Secondary | ICD-10-CM | POA: Insufficient documentation

## 2013-08-20 DIAGNOSIS — F17200 Nicotine dependence, unspecified, uncomplicated: Secondary | ICD-10-CM | POA: Insufficient documentation

## 2013-08-20 MED ORDER — SULFAMETHOXAZOLE-TRIMETHOPRIM 800-160 MG PO TABS: 1.0000 | ORAL_TABLET | Freq: Two times a day (BID) | ORAL | Status: AC

## 2013-08-20 MED ORDER — HYDROCODONE-ACETAMINOPHEN 5-325 MG PO TABS: 2.0000 | ORAL_TABLET | ORAL | Status: DC | PRN

## 2013-08-20 MED ORDER — CEPHALEXIN 500 MG PO CAPS
500.0000 mg | ORAL_CAPSULE | Freq: Four times a day (QID) | ORAL | Status: DC
Start: 2013-08-20 — End: 2013-12-05

## 2013-08-20 NOTE — Discharge Instructions (Signed)
Piercing Infection  Even though antiseptic technique has greatly improved over the past several years, body piercings can easily become infected if they are not cared for properly. The following instructions will help you avoid an infection caused by a body piercing.  · Always wash your hands prior to working on your piercing.  · Wash your pierced area gently with warm soap and water 6 to 8 times per day.  · Repeat the washing until the piercing does not have dried drainage or crusting around it.  · Do not use abrasive or rough material on the pierced area until it is healed.  · When finished washing, pat dry with a soft cloth. Do not rub dry.  · Apply antibiotic ointment lightly after each washing, or as directed. Move the jewelry back and forth in the piercing so some of the ointment will be worked into the piercing.  · Do not soak the pierced part, as in bathing or swimming, until it is healed. Avoid work out exercises that make you sweat heavily.  · During healing the pierced area may itch. Avoid scratching or picking at the piercing. You may use over-the-counter antihistamines in a dosage recommended on the package.  · If your caregiver has prescribed antibiotics, take them as directed. Finish them even if the piercing wound appears to be doing well.  SEEK IMMEDIATE MEDICAL CARE IF:  · You have increased swelling or pain in the pierced area.  · You develop increasing redness around the pierced area.  · You notice an increase in the amount of drainage coming from the piercing.  · You have a fever.  Document Released: 11/15/2002 Document Revised: 05/11/2011 Document Reviewed: 03/19/2008  ExitCare® Patient Information ©2015 [employer]. This information is not intended to replace advice given to you by your health care provider. Make sure you discuss any questions you have with your health care provider.

## 2013-08-20 NOTE — ED Provider Notes (Signed)
CSN: [id number]     Arrival date & time 08/20/13  1825 History   First MD Initiated Contact with Patient 08/20/13 1845     Chief Complaint  Patient presents with  . Abscess     (Consider location/radiation/quality/duration/timing/severity/associated sxs/prior Treatment) Patient is a 23 y.o. male presenting with abscess. The history is provided by the patient. No language interpreter was used.  Abscess Location:  Torso Torso abscess location:  R chest Abscess quality: draining, redness and warmth   Chronicity:  New Context: not diabetes   Relieved by:  Nothing Ineffective treatments:  None tried   Past Medical History  Diagnosis Date  . HIV disease    History reviewed. No pertinent past surgical history. Family History  Problem Relation Age of Onset  . Pulmonary fibrosis Mother   . Cancer Maternal Grandfather     prostate, colon  . Cancer Maternal Grandmother     breast   History  Substance Use Topics  . Smoking status: Current Every Day Smoker -- 0.40 packs/day for 7 years    Types: Cigarettes  . Smokeless tobacco: Never Used  . Alcohol Use: 0.0 oz/week     Comment: once monthly, social     Review of Systems  Skin: Positive for wound.  All other systems reviewed and are negative.     Allergies  Review of patient's allergies indicates no known allergies.  Home Medications   Prior to Admission medications   Medication Sig Start Date End Date Taking? Authorizing Provider  Emtricitab-Rilpivir-Tenofovir 200-25-300 MG TABS Take 1 tablet by mouth daily. 07/07/13   Ginnie SmartJeffrey C Hatcher, MD  ibuprofen (ADVIL,MOTRIN) 600 MG tablet Take 1 tablet (600 mg total) by mouth every 6 (six) hours as needed. 05/27/13   Ankit Rhunette CroftNanavati, MD   BP 145/98  Pulse 95  Temp(Src) 98.2 F (36.8 C) (Oral)  Resp 16  Ht 5\' 10"  (1.778 m)  Wt 174 lb (78.926 kg)  BMI 24.97 kg/m2  SpO2 100% Physical Exam  Vitals reviewed. Constitutional: He appears well-developed and well-nourished.   HENT:  Head: Normocephalic.  Cardiovascular: Normal rate.   Pulmonary/Chest: Effort normal.  Swollen area right nipple,   Drainage today  Neurological: He is alert.  Skin: There is erythema.    ED Course  INCISION AND DRAINAGE Date/Time: 08/20/2013 7:01 PM Performed by: Elson AreasSOFIA, LESLIE K Authorized by: Elson AreasSOFIA, LESLIE K Consent: Verbal consent obtained. Consent given by: patient Patient identity confirmed: verbally with patient Type: abscess Local anesthetic: lidocaine 2% without epinephrine Scalpel size: 11 Incision type: single straight Complexity: simple Wound treatment: wound left open Patient tolerance: Patient tolerated the procedure well with no immediate complications.   (including critical care time) Labs Review Labs Reviewed - No data to display  Imaging Review No results found.   EKG Interpretation None      MDM   Final diagnoses:  Skin infection    Bactrim Keflex hydrocodone Warm compresses Return if any problems   Elson AreasLeslie K Sofia, PA-C 08/20/13 1904  Lonia SkinnerLeslie K GervaisSofia, [place] JerseyPA-C 08/20/13 1905
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
## 2013-08-21 NOTE — ED Provider Notes (Signed)
Medical screening examination/treatment/procedure(s) were performed by non-physician practitioner and as supervising physician I was immediately available for consultation/collaboration.   EKG Interpretation None       Chantia Amalfitano, MD 08/21/13 0116 

## 2013-09-12 ENCOUNTER — Other Ambulatory Visit: Payer: Self-pay | Admitting: *Deleted

## 2013-09-12 DIAGNOSIS — B20 Human immunodeficiency virus [HIV] disease: Secondary | ICD-10-CM

## 2013-09-12 MED ORDER — EMTRICITAB-RILPIVIR-TENOFOV DF 200-25-300 MG PO TABS: 1.0000 | ORAL_TABLET | Freq: Every day | ORAL | Status: DC

## 2013-09-12 NOTE — Telephone Encounter (Signed)
ADAP Application 

## 2013-09-14 ENCOUNTER — Other Ambulatory Visit: Payer: Self-pay | Admitting: *Deleted

## 2013-09-14 DIAGNOSIS — B20 Human immunodeficiency virus [HIV] disease: Secondary | ICD-10-CM

## 2013-09-14 MED ORDER — EMTRICITAB-RILPIVIR-TENOFOV DF 200-25-300 MG PO TABS: 1.0000 | ORAL_TABLET | Freq: Every day | ORAL | Status: DC

## 2013-12-05 ENCOUNTER — Emergency Department (HOSPITAL_BASED_OUTPATIENT_CLINIC_OR_DEPARTMENT_OTHER)
Admission: EM | Admit: 2013-12-05 | Discharge: 2013-12-05 | Disposition: A | Discharge status: Home / Self Care | Payer: 59 | Attending: Emergency Medicine | Admitting: Emergency Medicine

## 2013-12-05 ENCOUNTER — Encounter (HOSPITAL_BASED_OUTPATIENT_CLINIC_OR_DEPARTMENT_OTHER): Payer: Self-pay | Admitting: Emergency Medicine

## 2013-12-05 ENCOUNTER — Emergency Department (HOSPITAL_BASED_OUTPATIENT_CLINIC_OR_DEPARTMENT_OTHER): Payer: 59

## 2013-12-05 DIAGNOSIS — Z21 Asymptomatic human immunodeficiency virus [HIV] infection status: Secondary | ICD-10-CM | POA: Insufficient documentation

## 2013-12-05 DIAGNOSIS — Y9389 Activity, other specified: Secondary | ICD-10-CM | POA: Insufficient documentation

## 2013-12-05 DIAGNOSIS — S43402A Unspecified sprain of left shoulder joint, initial encounter: Secondary | ICD-10-CM | POA: Diagnosis present

## 2013-12-05 DIAGNOSIS — Y92410 Unspecified street and highway as the place of occurrence of the external cause: Secondary | ICD-10-CM | POA: Diagnosis not present

## 2013-12-05 DIAGNOSIS — Z720 Tobacco use: Secondary | ICD-10-CM | POA: Insufficient documentation

## 2013-12-05 DIAGNOSIS — Z791 Long term (current) use of non-steroidal anti-inflammatories (NSAID): Secondary | ICD-10-CM | POA: Diagnosis not present

## 2013-12-05 DIAGNOSIS — S139XXA Sprain of joints and ligaments of unspecified parts of neck, initial encounter: Secondary | ICD-10-CM | POA: Diagnosis not present

## 2013-12-05 MED ORDER — CYCLOBENZAPRINE HCL 10 MG PO TABS: 10.0000 mg | ORAL_TABLET | Freq: Two times a day (BID) | ORAL | Status: DC | PRN

## 2013-12-05 MED ORDER — HYDROCODONE-ACETAMINOPHEN 5-325 MG PO TABS: 2.0000 | ORAL_TABLET | ORAL | Status: DC | PRN

## 2013-12-05 MED ORDER — KETOROLAC TROMETHAMINE 60 MG/2ML IM SOLN
60.0000 mg | Freq: Once | INTRAMUSCULAR | Status: AC
  Administered 2013-12-05: 60 mg via INTRAMUSCULAR
  Filled 2013-12-05: qty 2
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
## 2013-12-05 NOTE — Discharge Instructions (Signed)
Cervical Sprain °A cervical sprain is an injury in the neck in which the strong, fibrous tissues (ligaments) that connect your neck bones stretch or tear. Cervical sprains can range from mild to severe. Severe cervical sprains can cause the neck vertebrae to be unstable. This can lead to damage of the spinal cord and can result in serious nervous system problems. The amount of time it takes for a cervical sprain to get better depends on the cause and extent of the injury. Most cervical sprains heal in 1 to 3 weeks. °CAUSES  °Severe cervical sprains may be caused by:  °· Contact sport injuries (such as from football, rugby, wrestling, hockey, auto racing, gymnastics, diving, martial arts, or boxing).   °· Motor vehicle collisions.   °· Whiplash injuries. This is an injury from a sudden forward and backward whipping movement of the head and neck.  °· Falls.   °Mild cervical sprains may be caused by:  °· Being in an awkward position, such as while cradling a telephone between your ear and shoulder.   °· Sitting in a chair that does not offer proper support.   °· Working at a poorly designed computer station.   °· Looking up or down for long periods of time.   °SYMPTOMS  °· Pain, soreness, stiffness, or a burning sensation in the front, back, or sides of the neck. This discomfort may develop immediately after the injury or slowly, 24 hours or more after the injury.   °· Pain or tenderness directly in the middle of the back of the neck.   °· Shoulder or upper back pain.   °· Limited ability to move the neck.   °· Headache.   °· Dizziness.   °· Weakness, numbness, or tingling in the hands or arms.   °· Muscle spasms.   °· Difficulty swallowing or chewing.   °· Tenderness and swelling of the neck.   °DIAGNOSIS  °Most of the time your health care provider can diagnose a cervical sprain by taking your history and doing a physical exam. Your health care provider will ask about previous neck injuries and any known neck  problems, such as arthritis in the neck. X-rays may be taken to find out if there are any other problems, such as with the bones of the neck. Other tests, such as a CT scan or MRI, may also be needed.  °TREATMENT  °Treatment depends on the severity of the cervical sprain. Mild sprains can be treated with rest, keeping the neck in place (immobilization), and pain medicines. Severe cervical sprains are immediately immobilized. Further treatment is done to help with pain, muscle spasms, and other symptoms and may include: °· Medicines, such as pain relievers, numbing medicines, or muscle relaxants.   °· Physical therapy. This may involve stretching exercises, strengthening exercises, and posture training. Exercises and improved posture can help stabilize the neck, strengthen muscles, and help stop symptoms from returning.   °HOME CARE INSTRUCTIONS  °· Put ice on the injured area.   °¨ Put ice in a plastic bag.   °¨ Place a towel between your skin and the bag.   °¨ Leave the ice on for 15-20 minutes, 3-4 times a day.   °· If your injury was severe, you may have been given a cervical collar to wear. A cervical collar is a two-piece collar designed to keep your neck from moving while it heals. °¨ Do not remove the collar unless instructed by your health care provider. °¨ If you have long hair, keep it outside of the collar. °¨ Ask your health care provider before making any adjustments to your collar. Minor   adjustments may be required over time to improve comfort and reduce pressure on your chin or on the back of your head.  Ifyou are allowed to remove the collar for cleaning or bathing, follow your health care provider's instructions on how to do so safely.  Keep your collar clean by wiping it with mild soap and water and drying it completely. If the collar you have been given includes removable pads, remove them every 1-2 days and hand wash them with soap and water. Allow them to air dry. They should be completely  dry before you wear them in the collar.  If you are allowed to remove the collar for cleaning and bathing, wash and dry the skin of your neck. Check your skin for irritation or sores. If you see any, tell your health care provider.  Do not drive while wearing the collar.   Only take over-the-counter or prescription medicines for pain, discomfort, or fever as directed by your health care provider.   Keep all follow-up appointments as directed by your health care provider.   Keep all physical therapy appointments as directed by your health care provider.   Make any needed adjustments to your workstation to promote good posture.   Avoid positions and activities that make your symptoms worse.   Warm up and stretch before being active to help prevent problems.  SEEK MEDICAL CARE IF:   Your pain is not controlled with medicine.   You are unable to decrease your pain medicine over time as planned.   Your activity level is not improving as expected.  SEEK IMMEDIATE MEDICAL CARE IF:   You develop any bleeding.  You develop stomach upset.  You have signs of an allergic reaction to your medicine.   Your symptoms get worse.   You develop new, unexplained symptoms.   You have numbness, tingling, weakness, or paralysis in any part of your body.  MAKE SURE YOU:   Understand these instructions.  Will watch your condition.  Will get help right away if you are not doing well or get worse. Document Released: 12/14/2006 Document Revised: 02/21/2013 Document Reviewed: 08/24/2012 [facility] Patient Information 2015 [place], [place]. This information is not intended to replace advice given to you by your health care provider. Make sure you discuss any questions you have with your health care provider. Shoulder Sprain A shoulder sprain is the result of damage to the tough, fiber-like tissues (ligaments) that help hold your shoulder in place. The ligaments may be stretched or torn.  Besides the main shoulder joint (the ball and socket), there are several smaller joints that connect the bones in this area. A sprain usually involves one of those joints. Most often it is the acromioclavicular (or AC) joint. That is the joint that connects the collarbone (clavicle) and the shoulder blade (scapula) at the top point of the shoulder blade (acromion). A shoulder sprain is a mild form of what is called a shoulder separation. Recovering from a shoulder sprain may take some time. For some, pain lingers for several months. Most people recover without long term problems. CAUSES   A shoulder sprain is usually caused by some kind of trauma. This might be:  Falling on an outstretched arm.  Being hit hard on the shoulder.  Twisting the arm.  Shoulder sprains are more likely to occur in people who:  Play sports.  Have balance or coordination problems. SYMPTOMS   Pain when you move your shoulder.  Limited ability to move the shoulder.  Swelling and tenderness on top of the shoulder.  Redness or warmth in the shoulder.  Bruising.  A change in the shape of the shoulder. DIAGNOSIS  Your healthcare provider may:  Ask about your symptoms.  Ask about recent activity that might have caused those symptoms.  Examine your shoulder. You may be asked to do simple exercises to test movement. The other shoulder will be examined for comparison.  Order some tests that provide a look inside the body. They can show the extent of the injury. The tests could include:  X-rays.  CT (computed tomography) scan.  MRI (magnetic resonance imaging) scan. RISKS AND COMPLICATIONS  Loss of full shoulder motion.  Ongoing shoulder pain. TREATMENT  How long it takes to recover from a shoulder sprain depends on how severe it was. Treatment options may include:  Rest. You should not use the arm or shoulder until it heals.  Ice. For 2 or 3 days after the injury, put an ice pack on the shoulder  up to 4 times a day. It should stay on for 15 to 20 minutes each time. Wrap the ice in a towel so it does not touch your skin.  Over-the-counter medicine to relieve pain.  A sling or brace. This will keep the arm still while the shoulder is healing.  Physical therapy or rehabilitation exercises. These will help you regain strength and motion. Ask your healthcare provider when it is OK to begin these exercises.  Surgery. The need for surgery is rare with a sprained shoulder, but some people may need surgery to keep the joint in place and reduce pain. HOME CARE INSTRUCTIONS   Ask your healthcare provider about what you should and should not do while your shoulder heals.  Make sure you know how to apply ice to the correct area of your shoulder.  Talk with your healthcare provider about which medications should be used for pain and swelling.  If rehabilitation therapy will be needed, ask your healthcare provider to refer you to a therapist. If it is not recommended, then ask about at-home exercises. Find out when exercise should begin. SEEK MEDICAL CARE IF:  Your pain, swelling, or redness at the joint increases. SEEK IMMEDIATE MEDICAL CARE IF:   You have a fever.  You cannot move your arm or shoulder. Document Released: 07/05/2008 Document Revised: 05/11/2011 Document Reviewed: 07/05/2008 [facility] Patient Information 2015 [place], [place]. This information is not intended to replace advice given to you by your health care provider. Make sure you discuss any questions you have with your health care provider.
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
## 2013-12-22 ENCOUNTER — Ambulatory Visit (INDEPENDENT_AMBULATORY_CARE_PROVIDER_SITE_OTHER): Payer: 59 | Admitting: Family

## 2013-12-22 ENCOUNTER — Encounter: Payer: Self-pay | Admitting: Family

## 2013-12-22 ENCOUNTER — Other Ambulatory Visit (INDEPENDENT_AMBULATORY_CARE_PROVIDER_SITE_OTHER): Payer: 59

## 2013-12-22 VITALS — BP 142/100 | HR 101 | Temp 98.6°F | Resp 18 | Ht 70.0 in | Wt 166.8 lb

## 2013-12-22 DIAGNOSIS — Z8042 Family history of malignant neoplasm of prostate: Secondary | ICD-10-CM

## 2013-12-22 DIAGNOSIS — B20 Human immunodeficiency virus [HIV] disease: Secondary | ICD-10-CM

## 2013-12-22 DIAGNOSIS — R030 Elevated blood-pressure reading, without diagnosis of hypertension: Secondary | ICD-10-CM

## 2013-12-22 DIAGNOSIS — R599 Enlarged lymph nodes, unspecified: Secondary | ICD-10-CM

## 2013-12-22 DIAGNOSIS — R591 Generalized enlarged lymph nodes: Secondary | ICD-10-CM

## 2013-12-22 LAB — COMPLETE METABOLIC PANEL WITH GFR
ALT: 13 U/L (ref 0–53)
AST: 17 U/L (ref 0–37)
Albumin: 4.3 g/dL (ref 3.5–5.2)
Alkaline Phosphatase: 61 U/L (ref 39–117)
BUN: 9 mg/dL (ref 6–23)
CHLORIDE: 102 meq/L (ref 96–112)
CO2: 25 mEq/L (ref 19–32)
CREATININE: 0.94 mg/dL (ref 0.50–1.35)
Calcium: 9.7 mg/dL (ref 8.4–10.5)
GFR, Est African American: >89 mL/min
GFR, Est Non African American: >89 mL/min
Glucose, Bld: 82 mg/dL (ref 70–99)
Potassium: 4.3 mEq/L (ref 3.5–5.3)
SODIUM: 136 meq/L (ref 135–145)
TOTAL PROTEIN: 7.5 g/dL (ref 6.0–8.3)
Total Bilirubin: 0.5 mg/dL (ref 0.2–1.2)

## 2013-12-22 LAB — CBC WITH DIFFERENTIAL/PLATELET
BASOS ABS: 0 10*3/uL (ref 0.0–0.1)
Basophils Relative: 0 % (ref 0–1)
EOS PCT: 3 % (ref 0–5)
Eosinophils Absolute: 0.2 10*3/uL (ref 0.0–0.7)
HCT: 44.9 % (ref 39.0–52.0)
Hemoglobin: 15.3 g/dL (ref 13.0–17.0)
LYMPHS ABS: 1.4 10*3/uL (ref 0.7–4.0)
Lymphocytes Relative: 23 % (ref 12–46)
MCH: 29.8 pg (ref 26.0–34.0)
MCHC: 34.1 g/dL (ref 30.0–36.0)
MCV: 87.5 fL (ref 78.0–100.0)
Monocytes Absolute: 0.7 10*3/uL (ref 0.1–1.0)
Monocytes Relative: 12 % (ref 3–12)
NEUTROS ABS: 3.7 10*3/uL (ref 1.7–7.7)
Neutrophils Relative %: 62 % (ref 43–77)
PLATELETS: 427 10*3/uL — AB (ref 150–400)
RBC: 5.13 MIL/uL (ref 4.22–5.81)
RDW: 13 % (ref 11.5–15.5)
WBC: 5.9 10*3/uL (ref 4.0–10.5)

## 2013-12-22 LAB — T-HELPER CELL (CD4) - (RCID CLINIC ONLY)
CD4 T CELL HELPER: 41 % (ref 33–55)
CD4 T Cell Abs: 590 /uL (ref 400–2700)

## 2013-12-22 LAB — PSA: PSA: 0.9 ng/mL (ref 0.10–4.00)

## 2013-12-22 NOTE — Patient Instructions (Signed)
Thank you for choosing [facility].  Summary/Instructions:   Please stop by the lab before leaving for your blood work  We will be in contact with you regarding your ultrasound and appointment for ENT. If you have not heard back in a week, please let us know.  Please schedule a time for your physical.  Use Dove soap for your skin; if needed please schedule another appointment to have it evaluated.

## 2013-12-22 NOTE — Assessment & Plan Note (Signed)
Noted on right side of neck is mobile. Obtain US to rule out any pathology - possible CT. Refer to ENT for management of tonsils and enlarged node.

## 2013-12-22 NOTE — Assessment & Plan Note (Signed)
Pressure has remained elevated since his HIV diagnosis. Retake was lower 138/88. Follow up in 2 weeks.

## 2013-12-22 NOTE — Progress Notes (Signed)
   Subjective:    Patient ID: Christian Berry, male    DOB: 16-Dec-1990, 23 y.o.   MRN: [id number]  Chief Complaint  Patient presents with  . Establish Care    wants further testing for HIV that he was diagnosed with in april   HPI:  Christian Berry is a 23 y.o. male who presents today to establish care and . He is being followed by Dr. Ninetta LightsHatcher of Infectious Disease. His mother is present today for the visit with his permission.   1) Blood pressure - Since diagnosis of HIV in April has noted that his blood pressure has been high. Has not taken any mediations. Currently denies chest pain/discomfort, shortness of breath, palpitations or edema. As far as he know it has stayed about the same and has not worsened or improved, or nothing that make it better or worse.   BP Readings from Last 3 Encounters:  12/22/13 142/100  12/05/13 133/83  08/20/13 145/98   2) Concern for cancer - Concerns for prostate cancer; Has noticed his glands and his neck have been swollen since 2013. Every time he drinks his glands swell up they are stiff and sore.   No Known Allergies  Current Outpatient Prescriptions on File Prior to Visit  Medication Sig Dispense Refill  . cyclobenzaprine (FLEXERIL) 10 MG tablet Take 1 tablet (10 mg total) by mouth 2 (two) times daily as needed for muscle spasms.  10 tablet  0  . HYDROcodone-acetaminophen (NORCO) 5-325 MG per tablet Take 2 tablets by mouth every 4 (four) hours as needed.  10 tablet  0  . HYDROcodone-acetaminophen (NORCO/VICODIN) 5-325 MG per tablet Take 2 tablets by mouth every 4 (four) hours as needed.  20 tablet  0  . ibuprofen (ADVIL,MOTRIN) 600 MG tablet Take 1 tablet (600 mg total) by mouth every 6 (six) hours as needed.  30 tablet  0   No current facility-administered medications on file prior to visit.   Past Medical History  Diagnosis Date  . HIV disease     Diagnosed in April 2014     Review of Systems  See HPI    Objective:    BP 142/100   Pulse 101  Temp(Src) 98.6 F (37 C) (Oral)  Resp 18  Ht 5\' 10"  (1.778 m)  Wt 166 lb 12.8 oz (75.66 kg)  BMI 23.93 kg/m2  SpO2 95% Retake 138/88 Nursing note and vital signs reviewed. Physical Exam  Constitutional: He is oriented to person, place, and time. He appears well-developed and well-nourished. No distress.  Neck: No JVD present. No tracheal deviation present. No thyromegaly present.    Cardiovascular: Normal rate, regular rhythm and normal heart sounds.   Pulmonary/Chest: Effort normal and breath sounds normal. No stridor.  Lymphadenopathy:    He has cervical adenopathy.  Neurological: He is alert and oriented to person, place, and time.  Skin: Skin is warm and dry.  Psychiatric: He has a normal mood and affect. His behavior is normal. Judgment and thought content normal.       Assessment & Plan:

## 2013-12-22 NOTE — Progress Notes (Signed)
Pre visit review using our clinic review tool, if applicable. No additional management support is needed unless otherwise documented below in the visit note. 

## 2013-12-24 LAB — HIV-1 RNA QUANT-NO REFLEX-BLD: HIV 1 RNA Quant: <20 copies/mL (ref <20)

## 2013-12-25 ENCOUNTER — Other Ambulatory Visit: Payer: Self-pay

## 2013-12-26 ENCOUNTER — Other Ambulatory Visit: Payer: Self-pay | Admitting: Infectious Diseases

## 2013-12-26 DIAGNOSIS — B20 Human immunodeficiency virus [HIV] disease: Secondary | ICD-10-CM

## 2014-01-08 ENCOUNTER — Ambulatory Visit: Payer: Self-pay | Admitting: Infectious Diseases

## 2014-01-10 ENCOUNTER — Encounter: Payer: Self-pay | Admitting: Infectious Diseases

## 2014-01-10 ENCOUNTER — Ambulatory Visit (INDEPENDENT_AMBULATORY_CARE_PROVIDER_SITE_OTHER): Payer: 59 | Admitting: Infectious Diseases

## 2014-01-10 VITALS — BP 139/97 | HR 87 | Temp 98.1°F | Ht 70.0 in | Wt 160.0 lb

## 2014-01-10 DIAGNOSIS — Z79899 Other long term (current) drug therapy: Secondary | ICD-10-CM

## 2014-01-10 DIAGNOSIS — Z113 Encounter for screening for infections with a predominantly sexual mode of transmission: Secondary | ICD-10-CM

## 2014-01-10 DIAGNOSIS — B20 Human immunodeficiency virus [HIV] disease: Secondary | ICD-10-CM

## 2014-01-10 DIAGNOSIS — B354 Tinea corporis: Secondary | ICD-10-CM | POA: Insufficient documentation

## 2014-01-10 NOTE — Progress Notes (Signed)
   Subjective:    Patient ID: Christian Berry, male    DOB: [birth year], 23 y.o.   MRN: [id number]  HPI 23 yo M here after dx with HIV+ after STD/chlamydia visit to ED March 2015. He was started on complera, has not been taking with food recently. Denies missed doses . Has been having problems with dry skin recently. No previous, has mild pruritis.   HIV 1 RNA QUANT (copies/mL)  Date Value  12/22/2013 <20  08/08/2013 65*  05/30/2013 23855*   CD4 T CELL ABS (/uL)  Date Value  12/22/2013 590  08/08/2013 630  05/30/2013 540    Review of Systems  Constitutional: Positive for unexpected weight change. Negative for appetite change.  Gastrointestinal: Negative for diarrhea and constipation.  Genitourinary: Negative for difficulty urinating.       Objective:   Physical Exam  Constitutional: He appears well-developed and well-nourished.  HENT:  Mouth/Throat: No oropharyngeal exudate.  Small linear fissure on mid-upper lip. Non-heaped up.   Eyes: EOM are normal. Pupils are equal, round, and reactive to light.  Neck: Neck supple.  Cardiovascular: Normal rate, regular rhythm and normal heart sounds.   Pulmonary/Chest: Effort normal and breath sounds normal.  Abdominal: Soft. Bowel sounds are normal. He exhibits no distension. There is no tenderness.  Musculoskeletal: He exhibits no edema.  Lymphadenopathy:    He has cervical adenopathy.  Skin:  Few circular, peeling areas, mild hypopigmentation.           Assessment & Plan:

## 2014-01-10 NOTE — Assessment & Plan Note (Signed)
Advised him to try topical anti-yeast rx

## 2014-01-10 NOTE — Assessment & Plan Note (Addendum)
Gets flu shot today. Given condoms. Will continue complera, encouraged to take with food. Give him next hepatitis A vax today.  Will see him back in 6 months.

## 2014-01-16 ENCOUNTER — Other Ambulatory Visit: Payer: Self-pay | Admitting: Licensed Clinical Social Worker

## 2014-01-16 DIAGNOSIS — B20 Human immunodeficiency virus [HIV] disease: Secondary | ICD-10-CM

## 2014-01-16 MED ORDER — EMTRICITAB-RILPIVIR-TENOFOV DF 200-25-300 MG PO TABS: 1.0000 | ORAL_TABLET | Freq: Every day | ORAL | Status: DC

## 2014-02-22 ENCOUNTER — Other Ambulatory Visit: Payer: 59

## 2014-03-16 ENCOUNTER — Emergency Department (HOSPITAL_BASED_OUTPATIENT_CLINIC_OR_DEPARTMENT_OTHER): Payer: 59

## 2014-03-16 ENCOUNTER — Emergency Department (HOSPITAL_BASED_OUTPATIENT_CLINIC_OR_DEPARTMENT_OTHER)
Admission: EM | Admit: 2014-03-16 | Discharge: 2014-03-16 | Disposition: A | Discharge status: Home / Self Care | Payer: 59 | Attending: Emergency Medicine | Admitting: Emergency Medicine

## 2014-03-16 ENCOUNTER — Encounter (HOSPITAL_BASED_OUTPATIENT_CLINIC_OR_DEPARTMENT_OTHER): Payer: Self-pay | Admitting: Emergency Medicine

## 2014-03-16 DIAGNOSIS — Y9289 Other specified places as the place of occurrence of the external cause: Secondary | ICD-10-CM | POA: Insufficient documentation

## 2014-03-16 DIAGNOSIS — S99921A Unspecified injury of right foot, initial encounter: Secondary | ICD-10-CM | POA: Diagnosis present

## 2014-03-16 DIAGNOSIS — Z79899 Other long term (current) drug therapy: Secondary | ICD-10-CM | POA: Diagnosis not present

## 2014-03-16 DIAGNOSIS — Z720 Tobacco use: Secondary | ICD-10-CM | POA: Insufficient documentation

## 2014-03-16 DIAGNOSIS — S93401A Sprain of unspecified ligament of right ankle, initial encounter: Secondary | ICD-10-CM | POA: Diagnosis not present

## 2014-03-16 DIAGNOSIS — Z21 Asymptomatic human immunodeficiency virus [HIV] infection status: Secondary | ICD-10-CM | POA: Diagnosis not present

## 2014-03-16 DIAGNOSIS — Y9389 Activity, other specified: Secondary | ICD-10-CM | POA: Insufficient documentation

## 2014-03-16 DIAGNOSIS — Y998 Other external cause status: Secondary | ICD-10-CM | POA: Diagnosis not present

## 2014-03-16 DIAGNOSIS — W108XXA Fall (on) (from) other stairs and steps, initial encounter: Secondary | ICD-10-CM | POA: Insufficient documentation

## 2014-03-16 MED ORDER — IBUPROFEN 600 MG PO TABS: 600.0000 mg | ORAL_TABLET | Freq: Four times a day (QID) | ORAL | Status: DC | PRN

## 2014-03-16 MED ORDER — IBUPROFEN 800 MG PO TABS
800.0000 mg | ORAL_TABLET | Freq: Once | ORAL | Status: AC
Start: 2014-03-16 — End: 2014-03-16
  Administered 2014-03-16: 800 mg via ORAL
  Filled 2014-03-16: qty 1

## 2014-03-16 NOTE — ED Provider Notes (Signed)
CSN: [id number]     Arrival date & time 03/16/14  0015 History   First MD Initiated Contact with Patient 03/16/14 0114     Chief Complaint  Patient presents with  . Foot Injury     (Consider location/radiation/quality/duration/timing/severity/associated sxs/prior Treatment) HPI A couple hours prior to arrival patient fell on stairs. He describes his right ankle is having slid under and rolled. He denies any other associated injury. He reports it is painful for weightbearing. Past Medical History  Diagnosis Date  . HIV disease     Diagnosed in April 2014   History reviewed. No pertinent past surgical history. Family History  Problem Relation Age of Onset  . Pulmonary fibrosis Mother   . Cancer Maternal Grandfather     prostate, colon  . Cancer Maternal Grandmother     breast   History  Substance Use Topics  . Smoking status: Current Every Day Smoker -- 0.40 packs/day for 7 years    Types: Cigarettes  . Smokeless tobacco: Never Used  . Alcohol Use: 0.0 oz/week    0 Not specified per week     Comment: once monthly, social     Review of Systems Neurologic: No head injury, no headache no confusion.   Allergies  Review of patient's allergies indicates no known allergies.  Home Medications   Prior to Admission medications   Medication Sig Start Date End Date Taking? Authorizing Provider  cyclobenzaprine (FLEXERIL) 10 MG tablet Take 1 tablet (10 mg total) by mouth 2 (two) times daily as needed for muscle spasms. 12/05/13   Toy CookeyMegan Docherty, MD  Emtricitab-Rilpivir-Tenofovir (COMPLERA) 200-25-300 MG TABS Take 1 tablet by mouth daily. 01/16/14   Ginnie SmartJeffrey C Hatcher, MD  HYDROcodone-acetaminophen (NORCO) 5-325 MG per tablet Take 2 tablets by mouth every 4 (four) hours as needed. 12/05/13   Toy CookeyMegan Docherty, MD  ibuprofen (ADVIL,MOTRIN) 600 MG tablet Take 1 tablet (600 mg total) by mouth every 6 (six) hours as needed. 05/27/13   Derwood KaplanAnkit Nanavati, MD  ibuprofen (ADVIL,MOTRIN) 600 MG tablet  Take 1 tablet (600 mg total) by mouth every 6 (six) hours as needed. 03/16/14   Arby BarretteMarcy Darell Saputo, MD   BP 132/81 mmHg  Pulse 82  Temp(Src) 98.5 F (36.9 C) (Oral)  Resp 18  Ht 5\' 10"  (1.778 m)  Wt 150 lb (68.04 kg)  BMI 21.52 kg/m2  SpO2 99% Physical Exam  Constitutional: He is oriented to person, place, and time. He appears well-developed and well-nourished. No distress.  HENT:  Head: Normocephalic and atraumatic.  Eyes: EOM are normal.  Pulmonary/Chest: Effort normal.  Musculoskeletal: He exhibits tenderness. He exhibits no edema.  No objective deformity. Mild swelling anterior to the lateral malleolus. Patient endorses tenderness to palpation over the lateral malleolus. He also endorses tenderness over the fifth metatarsal. Is no erythema or effusion at this time.  Neurological: He is alert and oriented to person, place, and time. Coordination normal.  Skin: Skin is warm and dry.  Psychiatric: He has a normal mood and affect.    ED Course  Procedures (including critical care time) Labs Review Labs Reviewed - No data to display  Imaging Review Dg Ankle Complete Right  03/16/2014   CLINICAL DATA:  Status post fall down stairs, with pain and swelling at the right mid ankle. Initial encounter.  EXAM: RIGHT ANKLE - COMPLETE 3+ VIEW  COMPARISON:  None.  FINDINGS: There is no evidence of fracture or dislocation. The ankle mortise is intact; the interosseous space is within normal limits.  No talar tilt or subluxation is seen.  The joint spaces are preserved. Mild soft tissue swelling is noted overlying the lateral malleolus.  IMPRESSION: No evidence of fracture or dislocation.   Electronically Signed   By: Roanna Raider M.D.   On: 03/16/2014 02:43   Dg Foot Complete Right  03/16/2014   CLINICAL DATA:  Status post fall downstairs, with medial right foot pain. Initial encounter.  EXAM: RIGHT FOOT COMPLETE - 3+ VIEW  COMPARISON:  None.  FINDINGS: There is no evidence of fracture or  dislocation. The joint spaces are preserved. There is no evidence of talar subluxation; the subtalar joint is unremarkable in appearance.  No significant soft tissue abnormalities are seen.  IMPRESSION: No evidence of fracture or dislocation.   Electronically Signed   By: Roanna Raider M.D.   On: 03/16/2014 02:44     EKG Interpretation None      MDM   Final diagnoses:  Ankle sprain, right, initial encounter   X-rays are negative. Patient has no significant deformity. Patient will be provided with an air splint and instructions for ankle sprain management.    Arby Barrette, MD 03/16/14 [id number]

## 2014-03-16 NOTE — ED Notes (Signed)
Pt states that he was going down stairs and slipped down the stairs and hurt ankle

## 2014-03-16 NOTE — Discharge Instructions (Signed)

## 2014-03-31 ENCOUNTER — Ambulatory Visit: Payer: 59 | Admitting: Family Medicine

## 2014-04-03 ENCOUNTER — Encounter: Payer: Self-pay | Admitting: Family

## 2014-04-03 ENCOUNTER — Ambulatory Visit (INDEPENDENT_AMBULATORY_CARE_PROVIDER_SITE_OTHER): Payer: 59 | Admitting: Family

## 2014-04-03 DIAGNOSIS — S93401D Sprain of unspecified ligament of right ankle, subsequent encounter: Secondary | ICD-10-CM

## 2014-04-03 DIAGNOSIS — S93409A Sprain of unspecified ligament of unspecified ankle, initial encounter: Secondary | ICD-10-CM | POA: Insufficient documentation

## 2014-04-03 NOTE — Progress Notes (Signed)
Pre visit review using our clinic review tool, if applicable. No additional management support is needed unless otherwise documented below in the visit note. 

## 2014-04-03 NOTE — Progress Notes (Signed)
   Subjective:    Patient ID: Christian Berry, male    DOB: [birth year], 24 y.o.   MRN: [id number]  Chief Complaint  Patient presents with  . Follow-up    hurt it about 1 month ago, he says the swelling has went down, doesn't feel much better, still wants to give out    HPI:  Christian Berry is a 24 y.o. male who presents today to follow up on an ankle injury.     Initial ankle injury happened about 3 weeks ago when he fell down the stairs. Was seen at the hospital and there were no fractures. Currently experincing sharp and throbbing right ankle pain around the top of the ankle mortise. Pain intensity is a 6/10 and enough that he still needs to use crutches.Marland Kitchen.Epson salt, elevation, ice, Advil/ibuprofen provide temporary relief.   No Known Allergies   Current Outpatient Prescriptions on File Prior to Visit  Medication Sig Dispense Refill  . ibuprofen (ADVIL,MOTRIN) 600 MG tablet Take 1 tablet (600 mg total) by mouth every 6 (six) hours as needed. 30 tablet 0   No current facility-administered medications on file prior to visit.    Review of Systems  Musculoskeletal: Positive for joint swelling and arthralgias.  Neurological: Negative for numbness.      Objective:    BP 138/96 mmHg  Pulse 73  Temp(Src) 98 F (36.7 C) (Oral)  Resp 18  Ht 5\' 10"  (1.778 m)  Wt   SpO2 97% Nursing note and vital signs reviewed.  Physical Exam  Constitutional: He is oriented to person, place, and time. He appears well-developed and well-nourished. No distress.  Cardiovascular: Normal rate, regular rhythm, normal heart sounds and intact distal pulses.   Pulmonary/Chest: Effort normal and breath sounds normal.  Musculoskeletal:  Right ankle: No obvious deformity or discoloration noted. Edema is noted on the lateral aspect of the ankle anterior and inferior to the lateral malleolus. Decreased dorsiflexion, inversion and eversion. Positive patellar tilts and positive anterior drawer.    Neurological: He is alert and oriented to person, place, and time.  Skin: Skin is warm and dry.  Psychiatric: He has a normal mood and affect. His behavior is normal. Judgment and thought content normal.       Assessment & Plan:

## 2014-04-03 NOTE — Assessment & Plan Note (Signed)
Symptoms and exam consistent with lateral ankle sprain. Continue over-the-counter ibuprofen as needed. Continue to rest, ice, compress and elevate the area. Refer for physical therapy. Patient instructed on home exercises and weightbearing activities. Goal is to decrease crutch use as soon as possible. Follow-up in approximately 2 weeks or sooner if symptoms worsen.

## 2014-04-03 NOTE — Patient Instructions (Addendum)
Thank you for choosing [facility].  Summary/Instructions:  Referrals have been made during this visit. You should expect to hear back from our schedulers in about 7-10 days in regards to establishing an appointment with the specialists we discussed.   If your symptoms worsen or fail to improve, please contact our office for further instruction, or in case of emergency go directly to the emergency room at the closest medical facility.   Ankle Sprain An ankle sprain is an injury to the strong, fibrous tissues (ligaments) that hold the bones of your ankle joint together.  CAUSES An ankle sprain is usually caused by a fall or by twisting your ankle. Ankle sprains most commonly occur when you step on the outer edge of your foot, and your ankle turns inward. People who participate in sports are more prone to these types of injuries.  SYMPTOMS   Pain in your ankle. The pain may be present at rest or only when you are trying to stand or walk.  Swelling.  Bruising. Bruising may develop immediately or within 1 to 2 days after your injury.  Difficulty standing or walking, particularly when turning corners or changing directions. DIAGNOSIS  Your caregiver will ask you details about your injury and perform a physical exam of your ankle to determine if you have an ankle sprain. During the physical exam, your caregiver will press on and apply pressure to specific areas of your foot and ankle. Your caregiver will try to move your ankle in certain ways. An X-ray exam may be done to be sure a bone was not broken or a ligament did not separate from one of the bones in your ankle (avulsion fracture).  TREATMENT  Certain types of braces can help stabilize your ankle. Your caregiver can make a recommendation for this. Your caregiver may recommend the use of medicine for pain. If your sprain is severe, your caregiver may refer you to a surgeon who helps to restore function to parts of your skeletal system  (orthopedist) or a physical therapist. HOME CARE INSTRUCTIONS   Apply ice to your injury for 1-2 days or as directed by your caregiver. Applying ice helps to reduce inflammation and pain.  Put ice in a plastic bag.  Place a towel between your skin and the bag.  Leave the ice on for 15-20 minutes at a time, every 2 hours while you are awake.  Only take over-the-counter or prescription medicines for pain, discomfort, or fever as directed by your caregiver.  Elevate your injured ankle above the level of your heart as much as possible for 2-3 days.  If your caregiver recommends crutches, use them as instructed. Gradually put weight on the affected ankle. Continue to use crutches or a cane until you can walk without feeling pain in your ankle.  If you have a plaster splint, wear the splint as directed by your caregiver. Do not rest it on anything harder than a pillow for the first 24 hours. Do not put weight on it. Do not get it wet. You may take it off to take a shower or bath.  You may have been given an elastic bandage to wear around your ankle to provide support. If the elastic bandage is too tight (you have numbness or tingling in your foot or your foot becomes cold and blue), adjust the bandage to make it comfortable.  If you have an air splint, you may blow more air into it or let air out to make it more  comfortable. You may take your splint off at night and before taking a shower or bath. Wiggle your toes in the splint several times per day to decrease swelling. SEEK MEDICAL CARE IF:   You have rapidly increasing bruising or swelling.  Your toes feel extremely cold or you lose feeling in your foot.  Your pain is not relieved with medicine. SEEK IMMEDIATE MEDICAL CARE IF:  Your toes are numb or blue.  You have severe pain that is increasing. MAKE SURE YOU:   Understand these instructions.  Will watch your condition.  Will get help right away if you are not doing well or get  worse. Document Released: 02/16/2005 Document Revised: 11/11/2011 Document Reviewed: 02/28/2011 [facility] Patient Information 2015 [place], [place]. This information is not intended to replace advice given to you by your health care provider. Make sure you discuss any questions you have with your health care provider.

## 2014-04-04 ENCOUNTER — Telehealth: Payer: Self-pay | Admitting: Family

## 2014-04-04 NOTE — Telephone Encounter (Signed)
emmi mailed  °

## 2014-04-16 ENCOUNTER — Ambulatory Visit: Payer: 59

## 2014-04-19 ENCOUNTER — Telehealth: Payer: Self-pay

## 2014-04-19 NOTE — Telephone Encounter (Signed)
The forms would have [facility] for [facility] or [facility] work. If we do not have them we will need to obtain another copy and I will complete them.

## 2014-04-19 NOTE — Telephone Encounter (Signed)
Pt called and states that there was a from that was supposed to have been faxed over that wasn't received fully. He states that the first page was received but the rest of the form is missing. I had no recognition of what form pt was talking about. Please advise.

## 2014-04-19 NOTE — Telephone Encounter (Signed)
Returned pts call. Left message for him to call back.

## 2014-04-20 NOTE — Telephone Encounter (Signed)
Spoke with pt and advised him that he would have to bring the rest of the papers in. I couldn't find the FMLA papers he was talking about. He said that he was going to get his job to fax the paperwork over so we can get it filled out and resent. Gave pt the fax number for side B

## 2014-04-24 ENCOUNTER — Telehealth: Payer: Self-pay | Admitting: Family

## 2014-04-24 NOTE — Telephone Encounter (Signed)
Patient calling regarding FMLA paperwork. States that the info you sent in did not include his DOB. I have not seen this paperwork so I am not sure.Marland Kitchen..Marland Kitchen

## 2014-04-25 NOTE — Telephone Encounter (Signed)
Pt called back in  °

## 2014-04-25 NOTE — Telephone Encounter (Signed)
LVM for pt to call back to clarify on what he would like us to do. We did receive his FMLA papers through fax. Did he want us to refill them all and send them including his DOB?

## 2014-04-27 NOTE — Telephone Encounter (Signed)
Patient states he would like all forms to be refilled out and include his DOB.

## 2014-04-30 NOTE — Telephone Encounter (Signed)
The forms have been refilled and does include DOB. They have been refaxed as well.

## 2014-05-09 ENCOUNTER — Telehealth: Payer: Self-pay | Admitting: *Deleted

## 2014-05-09 NOTE — Telephone Encounter (Signed)
Received notice from insurance company that the patient has not requested refill of his complera.  Per Dr. Ninetta LightsHatcher, RN contacted patient to address this.  RN left message asking patient to call back and let us know if he is in need of refills, if he has missed any deliveries.  Patient is also due for a follow up visit with Dr. Ninetta LightsHatcher in May, and can schedule follow up now that the schedule is available. Andree CossHowell, Michelle M, RN

## 2014-06-15 ENCOUNTER — Other Ambulatory Visit (INDEPENDENT_AMBULATORY_CARE_PROVIDER_SITE_OTHER): Payer: 59

## 2014-06-15 DIAGNOSIS — B20 Human immunodeficiency virus [HIV] disease: Secondary | ICD-10-CM

## 2014-06-15 DIAGNOSIS — Z113 Encounter for screening for infections with a predominantly sexual mode of transmission: Secondary | ICD-10-CM | POA: Diagnosis not present

## 2014-06-15 DIAGNOSIS — Z79899 Other long term (current) drug therapy: Secondary | ICD-10-CM

## 2014-06-15 LAB — CBC
HCT: 48.3 % (ref 39.0–52.0)
Hemoglobin: 16.8 g/dL (ref 13.0–17.0)
MCH: 31.5 pg (ref 26.0–34.0)
MCHC: 34.8 g/dL (ref 30.0–36.0)
MCV: 90.4 fL (ref 78.0–100.0)
MPV: 10.1 fL (ref 8.6–12.4)
PLATELETS: 301 10*3/uL (ref 150–400)
RBC: 5.34 MIL/uL (ref 4.22–5.81)
RDW: 13.5 % (ref 11.5–15.5)
WBC: 4.7 10*3/uL (ref 4.0–10.5)

## 2014-06-15 LAB — COMPREHENSIVE METABOLIC PANEL
ALBUMIN: 4.7 g/dL (ref 3.5–5.2)
ALT: 24 U/L (ref 0–53)
AST: 24 U/L (ref 0–37)
Alkaline Phosphatase: 56 U/L (ref 39–117)
BUN: 9 mg/dL (ref 6–23)
CALCIUM: 9.9 mg/dL (ref 8.4–10.5)
CHLORIDE: 99 meq/L (ref 96–112)
CO2: 27 meq/L (ref 19–32)
CREATININE: 1 mg/dL (ref 0.50–1.35)
GLUCOSE: 85 mg/dL (ref 70–99)
Potassium: 4.2 mEq/L (ref 3.5–5.3)
Sodium: 138 mEq/L (ref 135–145)
Total Bilirubin: 0.5 mg/dL (ref 0.2–1.2)
Total Protein: 7.9 g/dL (ref 6.0–8.3)

## 2014-06-15 LAB — LIPID PANEL
CHOLESTEROL: 127 mg/dL (ref 0–200)
HDL: 42 mg/dL (ref >=40)
LDL Cholesterol: 65 mg/dL (ref 0–99)
TRIGLYCERIDES: 99 mg/dL (ref <150)
Total CHOL/HDL Ratio: 3 Ratio
VLDL: 20 mg/dL (ref 0–40)

## 2014-06-15 LAB — T-HELPER CELL (CD4) - (RCID CLINIC ONLY)
CD4 % Helper T Cell: 41 % (ref 33–55)
CD4 T Cell Abs: 830 /uL (ref 400–2700)

## 2014-06-16 LAB — RPR: RPR Ser Ql: REACTIVE — AB

## 2014-06-16 LAB — RPR TITER: RPR Titer: 1:64 {titer} — AB

## 2014-06-18 ENCOUNTER — Telehealth: Payer: Self-pay

## 2014-06-18 ENCOUNTER — Telehealth: Payer: Self-pay | Admitting: Infectious Diseases

## 2014-06-18 LAB — FLUORESCENT TREPONEMAL AB(FTA)-IGG-BLD: Fluorescent Treponemal ABS: REACTIVE — AB

## 2014-06-18 NOTE — Telephone Encounter (Signed)
called pt and left a message that he has a "positive test" and needs to come in for an injection

## 2014-06-18 NOTE — Telephone Encounter (Signed)
Patient is returning call from our office.    It seems Christian Berry called to inform patient of abnormal labs.  Christian Berry was reactive and he will need treatment.   Per Dr Ninetta LightsHatcher patient will need one dose of Bicillin LA 2.564mil. Patient advised and will come for injection on Tuesday morning, June 18, 2014.    Laurell Josephsammy K King, RN

## 2014-06-19 ENCOUNTER — Ambulatory Visit (INDEPENDENT_AMBULATORY_CARE_PROVIDER_SITE_OTHER): Payer: 59 | Admitting: Infectious Diseases

## 2014-06-19 DIAGNOSIS — A539 Syphilis, unspecified: Secondary | ICD-10-CM | POA: Diagnosis not present

## 2014-06-19 LAB — HIV-1 RNA QUANT-NO REFLEX-BLD
HIV 1 RNA Quant: <20 {copies}/mL
HIV-1 RNA Quant, Log: <1.3 {Log}

## 2014-06-19 MED ORDER — PENICILLIN G BENZATHINE 1200000 UNIT/2ML IM SUSP
1.2000 10*6.[IU] | Freq: Once | INTRAMUSCULAR | Status: AC
  Administered 2014-06-19: 1.2 10*6.[IU] via INTRAMUSCULAR

## 2014-06-19 NOTE — Progress Notes (Signed)
Patient ID: [name], male   DOB: [birth year], 24 y.o.   MRN: [id number] Here for bicillin shot.  No exam. Nurse visit only.

## 2014-06-26 ENCOUNTER — Ambulatory Visit: Payer: 59 | Admitting: Infectious Diseases

## 2014-07-31 ENCOUNTER — Other Ambulatory Visit: Payer: Self-pay | Admitting: Infectious Diseases

## 2014-08-16 ENCOUNTER — Encounter: Payer: Self-pay | Admitting: Infectious Diseases

## 2014-08-16 ENCOUNTER — Ambulatory Visit (INDEPENDENT_AMBULATORY_CARE_PROVIDER_SITE_OTHER): Payer: 59 | Admitting: Infectious Diseases

## 2014-08-16 ENCOUNTER — Other Ambulatory Visit: Payer: Self-pay | Admitting: *Deleted

## 2014-08-16 VITALS — BP 161/103 | HR 93 | Temp 98.8°F | Wt 171.0 lb

## 2014-08-16 DIAGNOSIS — H60391 Other infective otitis externa, right ear: Secondary | ICD-10-CM

## 2014-08-16 DIAGNOSIS — R030 Elevated blood-pressure reading, without diagnosis of hypertension: Secondary | ICD-10-CM

## 2014-08-16 DIAGNOSIS — B20 Human immunodeficiency virus [HIV] disease: Secondary | ICD-10-CM

## 2014-08-16 DIAGNOSIS — H60399 Other infective otitis externa, unspecified ear: Secondary | ICD-10-CM | POA: Insufficient documentation

## 2014-08-16 DIAGNOSIS — Z113 Encounter for screening for infections with a predominantly sexual mode of transmission: Secondary | ICD-10-CM | POA: Diagnosis not present

## 2014-08-16 DIAGNOSIS — Z23 Encounter for immunization: Secondary | ICD-10-CM | POA: Diagnosis not present

## 2014-08-16 DIAGNOSIS — Z79899 Other long term (current) drug therapy: Secondary | ICD-10-CM

## 2014-08-16 MED ORDER — LEVOFLOXACIN 500 MG PO TABS: 500.0000 mg | ORAL_TABLET | Freq: Every day | ORAL | Status: DC

## 2014-08-16 MED ORDER — CARBAMIDE PEROXIDE 6.5 % OT SOLN: 5.0000 [drp] | Freq: Two times a day (BID) | OTIC | Status: DC

## 2014-08-16 MED ORDER — EMTRICITAB-RILPIVIR-TENOFOV AF 200-25-25 MG PO TABS: 1.0000 | ORAL_TABLET | Freq: Every day | ORAL | Status: DC

## 2014-08-16 NOTE — Addendum Note (Signed)
Addended by: Wendall Mola A on: 08/16/2014 10:52 AM   Modules accepted: Orders

## 2014-08-16 NOTE — Assessment & Plan Note (Signed)
Will give him rx for levaquin and debrox.

## 2014-08-16 NOTE — Assessment & Plan Note (Signed)
Will monitor

## 2014-08-16 NOTE — Assessment & Plan Note (Signed)
He is doing well. Will change his complera to [facility].  He is given condoms Counseled him on having kids (condom use except during ovulation, disclosure, PREP for partner).  rtc in 6 months

## 2014-08-16 NOTE — Progress Notes (Signed)
   Subjective:    Patient ID: Christian Berry, male    DOB: Jul 19, 1990, 24 y.o.   MRN: [id number]  HPI 24 yo M here after dx with HIV+ after STD/chlamydia visit to ED March 2015. He was started on complera, Was in ID clinic 06-2014 for tx for syphillis.  BP markedly up today.  Has been having R ear pain, has hx of OM since childhood.   HIV 1 RNA QUANT (copies/mL)  Date Value  06/15/2014 <20  12/22/2013 <20  08/08/2013 65*   CD4 T CELL ABS (/uL)  Date Value  06/15/2014 830  12/22/2013 590  08/08/2013 630   Review of Systems R ear pain, loose BM, see HPI. Has questions about having kids.      Objective:   Physical Exam  Constitutional: He appears well-developed and well-nourished.  HENT:  Right Ear: There is drainage and swelling. A middle ear effusion is present.  Ears:  Mouth/Throat: No oropharyngeal exudate.  Eyes: EOM are normal. Pupils are equal, round, and reactive to light.  Neck: Neck supple.  Cardiovascular: Normal rate, regular rhythm and normal heart sounds.   Pulmonary/Chest: Effort normal and breath sounds normal.  Abdominal: Soft. Bowel sounds are normal. He exhibits no distension. There is no tenderness.  Lymphadenopathy:    He has no cervical adenopathy.       Assessment & Plan:

## 2014-10-10 ENCOUNTER — Ambulatory Visit: Payer: 59 | Admitting: Family

## 2014-10-10 DIAGNOSIS — Z0289 Encounter for other administrative examinations: Secondary | ICD-10-CM

## 2014-10-11 ENCOUNTER — Ambulatory Visit (INDEPENDENT_AMBULATORY_CARE_PROVIDER_SITE_OTHER): Payer: 59 | Admitting: Family

## 2014-10-11 ENCOUNTER — Encounter: Payer: Self-pay | Admitting: Family

## 2014-10-11 VITALS — BP 140/88 | HR 82 | Temp 98.2°F | Resp 18 | Ht 70.0 in | Wt 171.0 lb

## 2014-10-11 DIAGNOSIS — R21 Rash and other nonspecific skin eruption: Secondary | ICD-10-CM | POA: Diagnosis not present

## 2014-10-11 MED ORDER — HYDROCORTISONE 2.5 % EX CREA: TOPICAL_CREAM | Freq: Two times a day (BID) | CUTANEOUS | Status: DC

## 2014-10-11 NOTE — Progress Notes (Signed)
   Subjective:    Patient ID: Christian Berry, male    DOB: 1990-07-28, 24 y.o.   MRN: [id number]  Chief Complaint  Patient presents with  . Skin irritation    Skin around groin area is irritated, there is slight itching, looks like dry skin    HPI:  Christian Berry is a 24 y.o. male with a PMH of HIV who presents today for an acute office visit.  Associated symptom of skin irritation located in his groin on both sides which has been going on for about several days . Describes it as itchy with some redness and dry skin. Typically wears boxer/briefs. Denies any excessive sweating, but possibly on occasion. Denies any modifying factors that make it better. Denies any spreading or change in color since originally noticed.  No Known Allergies   Current Outpatient Prescriptions on File Prior to Visit  Medication Sig Dispense Refill  . carbamide peroxide (DEBROX) 6.5 % otic solution Place 5 drops into both ears 2 (two) times daily. 15 mL 0  . emtricitabine-rilpivir-tenofovir AF (ODEFSEY) 200-25-25 MG TABS per tablet Take 1 tablet by mouth daily with breakfast. 90 tablet 6  . ibuprofen (ADVIL,MOTRIN) 600 MG tablet Take 1 tablet (600 mg total) by mouth every 6 (six) hours as needed. 30 tablet 0   No current facility-administered medications on file prior to visit.        Review of Systems  Constitutional: Negative for fever and chills.  Skin: Positive for rash.      Objective:    BP 140/88 mmHg  Pulse 82  Temp(Src) 98.2 F (36.8 C) (Oral)  Resp 18  Ht  (1.778 m)  Wt 171 lb (77.565 kg)  BMI 24.54 kg/m2  SpO2 98% Nursing note and vital signs reviewed.  Physical Exam  Constitutional: He is oriented to person, place, and time. He appears well-developed and well-nourished. No distress.  Cardiovascular: Normal rate, regular rhythm, normal heart sounds and intact distal pulses.   Pulmonary/Chest: Effort normal and breath sounds normal.  Neurological: He is alert and  oriented to person, place, and time.  Skin: Skin is warm and dry.  Left testicle scrotum with appears dry with minor irritation. No penile tenderness or discharge noted. No masses present.   Psychiatric: He has a normal mood and affect. His behavior is normal. Judgment and thought content normal.       Assessment & Plan:   Problem List Items Addressed This Visit      Musculoskeletal and Integument   Rash and nonspecific skin eruption - Primary    Testicular rash consistent with dermatitis and dry skin. Start hydrocortisone 2.5% as needed for itching and use moisturizing soaps and lotions. Follow up if symptoms worsen or fail to improve.        Relevant Medications   hydrocortisone 2.5 % cream

## 2014-10-11 NOTE — Patient Instructions (Signed)
Thank you for choosing [facility].  Summary/Instructions:  Your prescription(s) have been submitted to your pharmacy or been printed and provided for you. Please take as directed and contact our office if you believe you are having problem(s) with the medication(s) or have any questions.  If your symptoms worsen or fail to improve, please contact our office for further instruction, or in case of emergency go directly to the emergency room at the closest medical facility.     

## 2014-10-11 NOTE — Progress Notes (Signed)
Pre visit review using our clinic review tool, if applicable. No additional management support is needed unless otherwise documented below in the visit note. 

## 2014-10-11 NOTE — Assessment & Plan Note (Signed)
Testicular rash consistent with dermatitis and dry skin. Start hydrocortisone 2.5% as needed for itching and use moisturizing soaps and lotions. Follow up if symptoms worsen or fail to improve.

## 2014-11-08 ENCOUNTER — Encounter (HOSPITAL_BASED_OUTPATIENT_CLINIC_OR_DEPARTMENT_OTHER): Payer: Self-pay | Admitting: *Deleted

## 2014-11-08 ENCOUNTER — Emergency Department (HOSPITAL_BASED_OUTPATIENT_CLINIC_OR_DEPARTMENT_OTHER)
Admission: EM | Admit: 2014-11-08 | Discharge: 2014-11-09 | Disposition: A | Discharge status: Home / Self Care | Payer: 59 | Attending: Emergency Medicine | Admitting: Emergency Medicine

## 2014-11-08 DIAGNOSIS — Z21 Asymptomatic human immunodeficiency virus [HIV] infection status: Secondary | ICD-10-CM | POA: Diagnosis not present

## 2014-11-08 DIAGNOSIS — K219 Gastro-esophageal reflux disease without esophagitis: Secondary | ICD-10-CM | POA: Diagnosis not present

## 2014-11-08 DIAGNOSIS — R143 Flatulence: Secondary | ICD-10-CM | POA: Diagnosis not present

## 2014-11-08 DIAGNOSIS — Z79899 Other long term (current) drug therapy: Secondary | ICD-10-CM | POA: Diagnosis not present

## 2014-11-08 DIAGNOSIS — Z720 Tobacco use: Secondary | ICD-10-CM | POA: Diagnosis not present

## 2014-11-08 DIAGNOSIS — R1013 Epigastric pain: Secondary | ICD-10-CM | POA: Diagnosis present

## 2014-11-08 LAB — URINALYSIS, ROUTINE W REFLEX MICROSCOPIC
BILIRUBIN URINE: NEGATIVE
Glucose, UA: NEGATIVE mg/dL
Ketones, ur: NEGATIVE mg/dL
Leukocytes, UA: NEGATIVE
Nitrite: NEGATIVE
Protein, ur: NEGATIVE mg/dL
SPECIFIC GRAVITY, URINE: 1.011 (ref 1.005–1.030)
UROBILINOGEN UA: 1 mg/dL (ref 0.0–1.0)
pH: 6.5 (ref 5.0–8.0)

## 2014-11-08 LAB — URINE MICROSCOPIC-ADD ON

## 2014-11-08 NOTE — ED Notes (Signed)
Registration reports pt left. 

## 2014-11-08 NOTE — ED Notes (Signed)
Updated about wait and process with rationale and expected time frame, "rooms opening with minimal wait".

## 2014-11-08 NOTE — ED Notes (Signed)
Upper abdominal pain x 3 days. Diarrhea to begin with that has resolved. States he feels weak and dehydrated.

## 2014-11-09 ENCOUNTER — Emergency Department (HOSPITAL_BASED_OUTPATIENT_CLINIC_OR_DEPARTMENT_OTHER): Payer: 59

## 2014-11-09 ENCOUNTER — Emergency Department (HOSPITAL_BASED_OUTPATIENT_CLINIC_OR_DEPARTMENT_OTHER)
Admission: EM | Admit: 2014-11-09 | Discharge: 2014-11-10 | Disposition: A | Discharge status: Home / Self Care | Payer: 59 | Source: Home / Self Care | Attending: Emergency Medicine | Admitting: Emergency Medicine

## 2014-11-09 ENCOUNTER — Encounter (HOSPITAL_BASED_OUTPATIENT_CLINIC_OR_DEPARTMENT_OTHER): Payer: Self-pay | Admitting: *Deleted

## 2014-11-09 DIAGNOSIS — IMO0001 Reserved for inherently not codable concepts without codable children: Secondary | ICD-10-CM

## 2014-11-09 DIAGNOSIS — Z21 Asymptomatic human immunodeficiency virus [HIV] infection status: Secondary | ICD-10-CM | POA: Insufficient documentation

## 2014-11-09 DIAGNOSIS — Z79899 Other long term (current) drug therapy: Secondary | ICD-10-CM | POA: Insufficient documentation

## 2014-11-09 DIAGNOSIS — K219 Gastro-esophageal reflux disease without esophagitis: Secondary | ICD-10-CM | POA: Insufficient documentation

## 2014-11-09 DIAGNOSIS — Z720 Tobacco use: Secondary | ICD-10-CM | POA: Insufficient documentation

## 2014-11-09 DIAGNOSIS — R143 Flatulence: Secondary | ICD-10-CM | POA: Insufficient documentation

## 2014-11-09 MED ORDER — GI COCKTAIL ~~LOC~~
30.0000 mL | Freq: Once | ORAL | Status: AC
  Administered 2014-11-09: 30 mL via ORAL
  Filled 2014-11-09: qty 30

## 2014-11-09 NOTE — ED Notes (Signed)
Abdominal pain. Diarrhea. He was here last night for same but left before he was seen.

## 2014-11-09 NOTE — ED Notes (Signed)
Patient walked to room 3 and given a gown to put on with opening in the back. Patient asked to use the restroom so patient given a cup to collect a urine sample.

## 2014-11-09 NOTE — ED Provider Notes (Signed)
CSN: [id number]     Arrival date & time 11/09/14  2037 History  This chart was scribed for Jimia Gentles, MD by Placido Sou, ED scribe. This patient was seen in room MH03/MH03 and the patient's care was started at 11:39 PM.  Chief Complaint  Patient presents with  . Abdominal Pain   Patient is a 24 y.o. male presenting with abdominal pain. The history is provided by the patient. No language interpreter was used.  Abdominal Pain Pain location:  Epigastric Pain quality: aching   Pain radiates to:  Does not radiate Pain severity:  Moderate Onset quality:  Gradual Duration:  4 days Timing:  Constant Progression:  Unchanged Chronicity:  New Context: not sick contacts and not trauma   Relieved by:  Nothing Worsened by:  Nothing tried Ineffective treatments:  None tried Associated symptoms: no dysuria, no fever, no shortness of breath and no vomiting   Associated symptoms comment:  Loose stool one to 2 episodes a day   HPI Comments: Christian Berry is a 24 y.o. male, with a hx of HIV, who presents to the Emergency Department complaining of constant, moderate, centralized abd pain with onset 4 days ago. He notes associated diarrhea 2x per day. Pt denies any new medications. He also notes he hasn't been eating fatty foods since onset to avoid any irritation. He denies any BM's today. He denies fever, vomiting, nausea, dysuria and frequency.   Past Medical History  Diagnosis Date  . HIV disease     Diagnosed in [place] 2014   History reviewed. No pertinent past surgical history. Family History  Problem Relation Age of Onset  . Pulmonary fibrosis Mother   . Cancer Maternal Grandfather     prostate, colon  . Cancer Maternal Grandmother     breast   Social History  Substance Use Topics  . Smoking status: Current Every Day Smoker -- 0.40 packs/day for 7 years    Types: Cigarettes  . Smokeless tobacco: Never Used  . Alcohol Use: 0.0 oz/week    0 Standard drinks or equivalent per  week     Comment: once monthly, social     Review of Systems  Constitutional: Negative for fever.  Respiratory: Negative for shortness of breath.   Gastrointestinal: Positive for abdominal pain. Negative for vomiting.  Genitourinary: Negative for dysuria.  All other systems reviewed and are negative.  Allergies  Review of patient's allergies indicates no known allergies.  Home Medications   Prior to Admission medications   Medication Sig Start Date End Date Taking? Authorizing Provider  carbamide peroxide (DEBROX) 6.5 % otic solution Place 5 drops into both ears 2 (two) times daily. 08/16/14   Ginnie Smart, MD  emtricitabine-rilpivir-tenofovir AF (ODEFSEY) 200-25-25 MG TABS per tablet Take 1 tablet by mouth daily with breakfast. 08/16/14   Ginnie Smart, MD  hydrocortisone 2.5 % cream Apply topically 2 (two) times daily. 10/11/14   Veryl Speak, FNP  ibuprofen (ADVIL,MOTRIN) 600 MG tablet Take 1 tablet (600 mg total) by mouth every 6 (six) hours as needed. 05/27/13   Ankit Nanavati, MD   BP 132/84 mmHg  Pulse 72  Temp(Src) 98.4 F (36.9 C) (Oral)  Resp 18  Ht 5\' 9"  (1.753 m)  Wt 171 lb (77.565 kg)  BMI 25.24 kg/m2  SpO2 100% Physical Exam  Constitutional: He is oriented to person, place, and time. He appears well-developed.  HENT:  Head: Normocephalic and atraumatic.  Mouth/Throat: Oropharynx is clear and moist.  Eyes:  EOM are normal. Pupils are equal, round, and reactive to light.  Neck: Normal range of motion. No tracheal deviation present.  Cardiovascular: Normal rate, regular rhythm, normal heart sounds and intact distal pulses.   Pulmonary/Chest: Effort normal and breath sounds normal. No respiratory distress. He has no wheezes. He has no rales.  Abdominal: Soft. He exhibits distension. He exhibits no mass. Bowel sounds are increased. There is no tenderness. There is no rebound and no guarding.  Musculoskeletal: Normal range of motion.  No lymphadenopathy    Neurological: He is alert and oriented to person, place, and time. He has normal reflexes.  Skin: Skin is warm and dry.  Psychiatric: He has a normal mood and affect.  Nursing note and vitals reviewed.  ED Course  Procedures  DIAGNOSTIC STUDIES: Oxygen Saturation is 100% on RA, normal by my interpretation.    COORDINATION OF CARE: 11:43 PM Discussed treatment plan with pt at bedside and pt agreed to plan.  Labs Review Labs Reviewed - No data to display  Imaging Review No results found. I have personally reviewed and evaluated these images and lab results as part of my medical decision-making.   EKG Interpretation None      MDM   Final diagnoses:  None   Results for orders placed or performed during the hospital encounter of 11/09/14  CBC with Differential/Platelet  Result Value Ref Range   WBC 8.0 4.0 - 10.5 K/uL   RBC 5.08 4.22 - 5.81 MIL/uL   Hemoglobin 16.0 13.0 - 17.0 g/dL   HCT 78.2 95.6 - 21.3 %   MCV 89.6 78.0 - 100.0 fL   MCH 31.5 26.0 - 34.0 pg   MCHC 35.2 30.0 - 36.0 g/dL   RDW 08.6 57.8 - 46.9 %   Platelets 335 150 - 400 K/uL   Neutrophils Relative % 54 43 - 77 %   Neutro Abs 4.4 1.7 - 7.7 K/uL   Lymphocytes Relative 31 12 - 46 %   Lymphs Abs 2.5 0.7 - 4.0 K/uL   Monocytes Relative 11 3 - 12 %   Monocytes Absolute 0.9 0.1 - 1.0 K/uL   Eosinophils Relative 4 0 - 5 %   Eosinophils Absolute 0.3 0.0 - 0.7 K/uL   Basophils Relative 0 0 - 1 %   Basophils Absolute 0.0 0.0 - 0.1 K/uL   WBC Morphology WHITE COUNT CONFIRMED ON SMEAR    Smear Review PLATELET COUNT CONFIRMED BY SMEAR   Comprehensive metabolic panel  Result Value Ref Range   Sodium 137 135 - 145 mmol/L   Potassium 3.5 3.5 - 5.1 mmol/L   Chloride 100 (L) 101 - 111 mmol/L   CO2 27 22 - 32 mmol/L   Glucose, Bld 84 65 - 99 mg/dL   BUN 10 6 - 20 mg/dL   Creatinine, Ser 6.29 0.61 - 1.24 mg/dL   Calcium 9.3 8.9 - 52.8 mg/dL   Total Protein 7.6 6.5 - 8.1 g/dL   Albumin 4.3 3.5 - 5.0 g/dL    AST 21 15 - 41 U/L   ALT 15 (L) 17 - 63 U/L   Alkaline Phosphatase 53 38 - 126 U/L   Total Bilirubin 0.5 0.3 - 1.2 mg/dL   GFR calc non Af Amer >60 >60 mL/min   GFR calc Af Amer >60 >60 mL/min   Anion gap 10 5 - 15  Lipase, blood  Result Value Ref Range   Lipase 21 (L) 22 - 51 U/L   Dg Abd Acute  W/chest  11/10/2014   CLINICAL DATA:  Mid abdominal pain with diarrhea for 5 days.  EXAM: DG ABDOMEN ACUTE W/ 1V CHEST  COMPARISON:  Chest 10/02/2009.  CT abdomen and pelvis 12/22/2007  FINDINGS: Normal heart size and pulmonary vascularity. No focal airspace disease or consolidation in the lungs. No blunting of costophrenic angles. No pneumothorax. Mediastinal contours appear intact.  Gas and stool throughout the colon. No small or large bowel distention. No free intra-abdominal air. No abnormal air-fluid levels. No radiopaque stones. Visualized bones appear intact.  IMPRESSION: No evidence of active pulmonary disease. Normal nonobstructive bowel gas pattern.   Electronically Signed   By: Burman Nieves M.D.   On: 11/10/2014 00:23    Medications  gi cocktail (Maalox,Lidocaine,Donnatal) (30 mLs Oral Given 11/09/14 2358)  dicyclomine (BENTYL) capsule 10 mg (10 mg Oral Given 11/10/14 0117)  Exam and vitals are benign and reassuring   Gas moving through post medication and feels markedly improved.  Well appearing.  Will start GERD medication and bland diet and recommend [place] yogurt BID to repopulate the bowel with normal flora.  Follow up with your PMD for recheck Monday.  Strict return precautions given/   I personally performed the services described in this documentation, which was scribed in my presence. The recorded information has been reviewed and is accurate.     Cy Blamer, MD 11/10/14 [phone]

## 2014-11-10 ENCOUNTER — Encounter (HOSPITAL_BASED_OUTPATIENT_CLINIC_OR_DEPARTMENT_OTHER): Payer: Self-pay | Admitting: Emergency Medicine

## 2014-11-10 LAB — COMPREHENSIVE METABOLIC PANEL
ALBUMIN: 4.3 g/dL (ref 3.5–5.0)
ALT: 15 U/L — AB (ref 17–63)
AST: 21 U/L (ref 15–41)
Alkaline Phosphatase: 53 U/L (ref 38–126)
Anion gap: 10 (ref 5–15)
BUN: 10 mg/dL (ref 6–20)
CALCIUM: 9.3 mg/dL (ref 8.9–10.3)
CHLORIDE: 100 mmol/L — AB (ref 101–111)
CO2: 27 mmol/L (ref 22–32)
CREATININE: 0.95 mg/dL (ref 0.61–1.24)
GFR calc non Af Amer: >60 mL/min (ref >60)
GLUCOSE: 84 mg/dL (ref 65–99)
Potassium: 3.5 mmol/L (ref 3.5–5.1)
SODIUM: 137 mmol/L (ref 135–145)
Total Bilirubin: 0.5 mg/dL (ref 0.3–1.2)
Total Protein: 7.6 g/dL (ref 6.5–8.1)

## 2014-11-10 LAB — CBC WITH DIFFERENTIAL/PLATELET
BASOS ABS: 0 10*3/uL (ref 0.0–0.1)
Basophils Relative: 0 % (ref 0–1)
Eosinophils Absolute: 0.3 10*3/uL (ref 0.0–0.7)
Eosinophils Relative: 4 % (ref 0–5)
HCT: 45.5 % (ref 39.0–52.0)
HEMOGLOBIN: 16 g/dL (ref 13.0–17.0)
LYMPHS PCT: 31 % (ref 12–46)
Lymphs Abs: 2.5 10*3/uL (ref 0.7–4.0)
MCH: 31.5 pg (ref 26.0–34.0)
MCHC: 35.2 g/dL (ref 30.0–36.0)
MCV: 89.6 fL (ref 78.0–100.0)
MONO ABS: 0.9 10*3/uL (ref 0.1–1.0)
Monocytes Relative: 11 % (ref 3–12)
Neutro Abs: 4.4 10*3/uL (ref 1.7–7.7)
Neutrophils Relative %: 54 % (ref 43–77)
Platelets: 335 10*3/uL (ref 150–400)
RBC: 5.08 MIL/uL (ref 4.22–5.81)
RDW: 12.2 % (ref 11.5–15.5)
WBC: 8 10*3/uL (ref 4.0–10.5)

## 2014-11-10 LAB — LIPASE, BLOOD: Lipase: 21 U/L — ABNORMAL LOW (ref 22–51)

## 2014-11-10 MED ORDER — DICYCLOMINE HCL 10 MG PO CAPS
10.0000 mg | ORAL_CAPSULE | Freq: Once | ORAL | Status: AC
  Administered 2014-11-10: 10 mg via ORAL
  Filled 2014-11-10: qty 1

## 2014-11-10 MED ORDER — SUCRALFATE 1 GM/10ML PO SUSP: 1.0000 g | Freq: Three times a day (TID) | ORAL | Status: DC

## 2014-11-10 MED ORDER — OMEPRAZOLE 20 MG PO CPDR: 20.0000 mg | DELAYED_RELEASE_CAPSULE | Freq: Every day | ORAL | Status: DC

## 2014-11-20 ENCOUNTER — Other Ambulatory Visit: Payer: Self-pay | Admitting: Infectious Diseases

## 2014-11-20 ENCOUNTER — Telehealth: Payer: Self-pay | Admitting: *Deleted

## 2014-11-20 NOTE — Telephone Encounter (Signed)
Patient requesting refill of complera. He was changed to [facility] at last office visit 6/16.  RN contacted pharmacy, both are available for $0 copay.  RN contacted patient.  He stated that the [facility] had been a plan exclusion the last time he attempted to fill it.  He will call the pharmacy to get more information, will let us know which one he is filling so we can update his medical record. Andree Coss, RN

## 2015-01-23 ENCOUNTER — Other Ambulatory Visit: Payer: 59

## 2015-01-23 DIAGNOSIS — Z113 Encounter for screening for infections with a predominantly sexual mode of transmission: Secondary | ICD-10-CM

## 2015-01-23 DIAGNOSIS — Z79899 Other long term (current) drug therapy: Secondary | ICD-10-CM

## 2015-01-23 DIAGNOSIS — B20 Human immunodeficiency virus [HIV] disease: Secondary | ICD-10-CM

## 2015-01-23 LAB — CBC
HEMATOCRIT: 48.5 % (ref 39.0–52.0)
HEMOGLOBIN: 17.1 g/dL — AB (ref 13.0–17.0)
MCH: 31.8 pg (ref 26.0–34.0)
MCHC: 35.3 g/dL (ref 30.0–36.0)
MCV: 90.3 fL (ref 78.0–100.0)
MPV: 10.2 fL (ref 8.6–12.4)
PLATELETS: 329 10*3/uL (ref 150–400)
RBC: 5.37 MIL/uL (ref 4.22–5.81)
RDW: 12.3 % (ref 11.5–15.5)
WBC: 5.6 10*3/uL (ref 4.0–10.5)

## 2015-01-23 LAB — COMPREHENSIVE METABOLIC PANEL
ALBUMIN: 4.5 g/dL (ref 3.6–5.1)
ALK PHOS: 52 U/L (ref 40–115)
ALT: 15 U/L (ref 9–46)
AST: 16 U/L (ref 10–40)
BUN: 9 mg/dL (ref 7–25)
CO2: 28 mmol/L (ref 20–31)
Calcium: 9.6 mg/dL (ref 8.6–10.3)
Chloride: 100 mmol/L (ref 98–110)
Creat: 0.97 mg/dL (ref 0.60–1.35)
Glucose, Bld: 94 mg/dL (ref 65–99)
POTASSIUM: 3.9 mmol/L (ref 3.5–5.3)
Sodium: 138 mmol/L (ref 135–146)
TOTAL PROTEIN: 7.3 g/dL (ref 6.1–8.1)
Total Bilirubin: 0.7 mg/dL (ref 0.2–1.2)

## 2015-01-23 LAB — RPR

## 2015-01-23 LAB — LIPID PANEL
CHOL/HDL RATIO: 2.5 ratio (ref <=5.0)
CHOLESTEROL: 94 mg/dL — AB (ref 125–200)
HDL: 37 mg/dL — ABNORMAL LOW (ref >=40)
LDL Cholesterol: 47 mg/dL (ref <130)
TRIGLYCERIDES: 48 mg/dL (ref <150)
VLDL: 10 mg/dL (ref <30)

## 2015-01-25 LAB — T-HELPER CELL (CD4) - (RCID CLINIC ONLY)
CD4 T CELL ABS: 1080 /uL (ref 400–2700)
CD4 T CELL HELPER: 42 % (ref 33–55)

## 2015-01-27 LAB — HIV-1 RNA QUANT-NO REFLEX-BLD
HIV 1 RNA Quant: <20 copies/mL (ref <20)
HIV-1 RNA Quant, Log: <1.3 Log copies/mL (ref <1.30)

## 2015-02-11 ENCOUNTER — Encounter: Payer: Self-pay | Admitting: Infectious Diseases

## 2015-02-13 ENCOUNTER — Encounter: Payer: Self-pay | Admitting: Infectious Diseases

## 2015-02-13 ENCOUNTER — Ambulatory Visit (INDEPENDENT_AMBULATORY_CARE_PROVIDER_SITE_OTHER): Payer: 59 | Admitting: Infectious Diseases

## 2015-02-13 ENCOUNTER — Encounter: Payer: Self-pay | Admitting: *Deleted

## 2015-02-13 VITALS — BP 144/94 | HR 84 | Temp 98.3°F | Ht 69.0 in | Wt 172.8 lb

## 2015-02-13 DIAGNOSIS — F32A Depression, unspecified: Secondary | ICD-10-CM | POA: Insufficient documentation

## 2015-02-13 DIAGNOSIS — B20 Human immunodeficiency virus [HIV] disease: Secondary | ICD-10-CM | POA: Diagnosis not present

## 2015-02-13 DIAGNOSIS — Z23 Encounter for immunization: Secondary | ICD-10-CM

## 2015-02-13 DIAGNOSIS — F329 Major depressive disorder, single episode, unspecified: Secondary | ICD-10-CM

## 2015-02-13 NOTE — Assessment & Plan Note (Signed)
He is doing well.  Flu shot today Start HPV vax Given condoms Reviewed his labs with him rtc in 6 months

## 2015-02-13 NOTE — Assessment & Plan Note (Signed)
Will have him seen by mental health today.

## 2015-02-13 NOTE — BH Specialist Note (Signed)
Counselor met with Christian Berry in the [place] room for depression symptoms.  Patient indicated that he has never really got over getting diagnosed with HIV.  Patient shared that he had a rough childhood but was supported by his mother and aunt.  Counselor encouraged patient to meet to further discuss what is going on in his life and process the diagnosis of HIV. Patient agreed and made an initial appointment with counselor.

## 2015-02-13 NOTE — Progress Notes (Signed)
   Subjective:    Patient ID: Christian Berry, male    DOB: [birth year], 24 y.o.   MRN: [id number]  HPI 24 yo M here after dx with HIV+ after STD/chlamydia visit to ED March 2015. He was started on complera, switched to odefsy at last visit.  Wants to talk to Bernette RedbirdKenny about his dx of HIV+, states he has not adjusted to it. Perseverates.  Sleeps well.   HIV 1 RNA QUANT (copies/mL)  Date Value  01/23/2015 <20  06/15/2014 <20  12/22/2013 <20   CD4 T CELL ABS (/uL)  Date Value  01/23/2015 1080  06/15/2014 830  12/22/2013 590    Review of Systems  Constitutional: Negative for appetite change and unexpected weight change.  Respiratory: Negative for cough and shortness of breath.   Gastrointestinal: Negative for diarrhea and constipation.  Genitourinary: Negative for difficulty urinating.  Psychiatric/Behavioral: Positive for dysphoric mood.       Objective:   Physical Exam  Constitutional: He appears well-developed and well-nourished.  HENT:  Mouth/Throat: No oropharyngeal exudate.  Eyes: EOM are normal. Pupils are equal, round, and reactive to light.  Neck: Neck supple.  Cardiovascular: Normal rate, regular rhythm and normal heart sounds.   Pulmonary/Chest: Effort normal and breath sounds normal.  Abdominal: Soft. Bowel sounds are normal. There is no tenderness. There is no rebound.  Lymphadenopathy:    He has no cervical adenopathy.       Assessment & Plan:

## 2015-03-20 ENCOUNTER — Ambulatory Visit: Payer: 59 | Admitting: *Deleted

## 2015-03-21 ENCOUNTER — Ambulatory Visit: Payer: 59 | Admitting: *Deleted

## 2015-04-17 ENCOUNTER — Ambulatory Visit: Payer: 59 | Admitting: *Deleted

## 2015-05-07 ENCOUNTER — Telehealth: Payer: Self-pay | Admitting: Pharmacist Clinician (PhC)/ Clinical Pharmacy Specialist

## 2015-05-07 ENCOUNTER — Other Ambulatory Visit: Payer: Self-pay | Admitting: Pharmacist Clinician (PhC)/ Clinical Pharmacy Specialist

## 2015-05-07 NOTE — Telephone Encounter (Signed)
Christian Berry popped up on out recent interaction [facility] about PPI and RPV related products. Talked to him today and he is no longer on any PPI. Have taken prilosec off of profile..Marland Kitchen

## 2015-05-08 NOTE — Telephone Encounter (Signed)
thanks

## 2015-07-31 ENCOUNTER — Other Ambulatory Visit: Payer: 59

## 2015-07-31 DIAGNOSIS — B20 Human immunodeficiency virus [HIV] disease: Secondary | ICD-10-CM

## 2015-07-31 LAB — CBC
HCT: 46.6 % (ref 38.5–50.0)
HEMOGLOBIN: 16 g/dL (ref 13.2–17.1)
MCH: 30.8 pg (ref 27.0–33.0)
MCHC: 34.3 g/dL (ref 32.0–36.0)
MCV: 89.6 fL (ref 80.0–100.0)
MPV: 9.8 fL (ref 7.5–12.5)
PLATELETS: 336 10*3/uL (ref 140–400)
RBC: 5.2 MIL/uL (ref 4.20–5.80)
RDW: 13.1 % (ref 11.0–15.0)
WBC: 7.2 10*3/uL (ref 3.8–10.8)

## 2015-07-31 LAB — COMPREHENSIVE METABOLIC PANEL
ALBUMIN: 4.7 g/dL (ref 3.6–5.1)
ALK PHOS: 52 U/L (ref 40–115)
ALT: 21 U/L (ref 9–46)
AST: 17 U/L (ref 10–40)
BUN: 9 mg/dL (ref 7–25)
CO2: 28 mmol/L (ref 20–31)
CREATININE: 1.03 mg/dL (ref 0.60–1.35)
Calcium: 10 mg/dL (ref 8.6–10.3)
Chloride: 101 mmol/L (ref 98–110)
Glucose, Bld: 94 mg/dL (ref 65–99)
Potassium: 4 mmol/L (ref 3.5–5.3)
SODIUM: 140 mmol/L (ref 135–146)
TOTAL PROTEIN: 7.3 g/dL (ref 6.1–8.1)
Total Bilirubin: 0.4 mg/dL (ref 0.2–1.2)

## 2015-07-31 LAB — LIPID PANEL
CHOLESTEROL: 137 mg/dL (ref 125–200)
HDL: 46 mg/dL (ref >=40)
LDL Cholesterol: 73 mg/dL (ref <130)
Total CHOL/HDL Ratio: 3 Ratio (ref <=5.0)
Triglycerides: 88 mg/dL (ref <150)
VLDL: 18 mg/dL (ref <30)

## 2015-08-01 ENCOUNTER — Telehealth: Payer: Self-pay | Admitting: *Deleted

## 2015-08-01 LAB — HIV-1 RNA QUANT-NO REFLEX-BLD: HIV 1 RNA Quant: <20 copies/mL (ref <20)

## 2015-08-01 LAB — FLUORESCENT TREPONEMAL AB(FTA)-IGG-BLD: FLUORESCENT TREPONEMAL ABS: REACTIVE — AB

## 2015-08-01 LAB — URINE CYTOLOGY ANCILLARY ONLY
Chlamydia: NEGATIVE
NEISSERIA GONORRHEA: NEGATIVE

## 2015-08-01 LAB — RPR: RPR Ser Ql: REACTIVE — AB

## 2015-08-01 LAB — RPR TITER

## 2015-08-01 LAB — T-HELPER CELL (CD4) - (RCID CLINIC ONLY)
CD4 % Helper T Cell: 42 % (ref 33–55)
CD4 T CELL ABS: 1290 /uL (ref 400–2700)

## 2015-08-01 NOTE — Telephone Encounter (Signed)
Please see note below from Dr. Daiva EvesVan Dam. Christian MolaJacqueline Kraig Berry

## 2015-08-01 NOTE — Telephone Encounter (Signed)
-----   Message from Randall Hissornelius N Van Dam, MD sent at 08/01/2015  8:45 AM EDT ----- This is low level titer. Not clear that this is new syphilis but more likely just fluctuation around baseline

## 2015-08-14 ENCOUNTER — Ambulatory Visit (INDEPENDENT_AMBULATORY_CARE_PROVIDER_SITE_OTHER): Payer: 59 | Admitting: Infectious Diseases

## 2015-08-14 ENCOUNTER — Encounter: Payer: Self-pay | Admitting: Infectious Diseases

## 2015-08-14 VITALS — BP 152/96 | HR 86 | Temp 98.1°F | Ht 71.0 in | Wt 174.0 lb

## 2015-08-14 DIAGNOSIS — B20 Human immunodeficiency virus [HIV] disease: Secondary | ICD-10-CM

## 2015-08-14 DIAGNOSIS — Z113 Encounter for screening for infections with a predominantly sexual mode of transmission: Secondary | ICD-10-CM

## 2015-08-14 DIAGNOSIS — J4541 Moderate persistent asthma with (acute) exacerbation: Secondary | ICD-10-CM

## 2015-08-14 DIAGNOSIS — F17200 Nicotine dependence, unspecified, uncomplicated: Secondary | ICD-10-CM | POA: Diagnosis not present

## 2015-08-14 DIAGNOSIS — J45909 Unspecified asthma, uncomplicated: Secondary | ICD-10-CM | POA: Insufficient documentation

## 2015-08-14 MED ORDER — MOMETASONE FURO-FORMOTEROL FUM 100-5 MCG/ACT IN AERO: 2.0000 | INHALATION_SPRAY | Freq: Every day | RESPIRATORY_TRACT | Status: AC

## 2015-08-14 NOTE — Progress Notes (Signed)
   Subjective:    Patient ID: Christian Berry, male    DOB: 1990-04-06, 25 y.o.   MRN: [id number]  HPI Patient isa 25 year old male diagnosed with HIV in 2015.  He was previously on Complera, but for modernization of ART was transitioned to [place].  Patient reports taking medication every AM with food.  He denies any missed doses and side effects from ART.  Patient seen in clinic for routine follow-up; however, he reports concern with dry cough.  He states he presented to ED for symptoms of cough, which began a month ago.  He was given a respiratory treatment and inhaler.  He states he was discharged with a diagnosis of acute bronchitis.  Although the inhaler has helped he is concerned because the cough remains. He does have an inhaler at home, which he obtained from his mother.   Review of Systems  Constitutional: Negative for fever and chills.  HENT: Negative for congestion, mouth sores and sore throat.   Respiratory: Positive for cough (frequent without sputum production) and shortness of breath.   Cardiovascular: Negative for chest pain.  Gastrointestinal: Negative for nausea, abdominal pain and diarrhea.  Genitourinary: Negative for dysuria and discharge.  Neurological: Negative for headaches.      Objective:   Physical Exam  Constitutional: He is oriented to person, place, and time. He appears well-developed and well-nourished.  HENT:  Mouth/Throat: No oropharyngeal exudate.  Eyes: Pupils are equal, round, and reactive to light.  Cardiovascular: Normal rate, regular rhythm and normal heart sounds.  Exam reveals no friction rub.   No murmur heard. Pulmonary/Chest: Effort normal and breath sounds normal. No respiratory distress. He has no wheezes.  Abdominal: Soft. Bowel sounds are normal. He exhibits no distension. There is no tenderness.  Lymphadenopathy:    He has no cervical adenopathy.  Neurological: He is alert and oriented to person, place, and time.  Psychiatric: He has a  normal mood and affect.      Assessment & Plan:

## 2015-08-14 NOTE — Assessment & Plan Note (Addendum)
Patient is in good spirits today. He is well controlled on current regimen. Condoms offered, refused. RTC in 6 months with labs prior. **Patient states he will be [place]ocating to [place] in the future and would like information on clinics for treatment.  Patient will be connected with an HIV provider once he is ready for the transfer.**

## 2015-08-14 NOTE — Assessment & Plan Note (Addendum)
Patient reports frequent, dry cough. He reports albuterol inhaler assists with symptoms, however, symptoms not entirely resolved. Patient reports smoking black cigars. Smoking cessation discussed with patient. Patient motivated to quit. Ordered Dulera inhaler 2 puffs daily to assist with symptoms. Will continue to monitor.

## 2015-08-14 NOTE — Assessment & Plan Note (Signed)
Pt encouraged to quit smoking.  Will f/u at next visit.

## 2015-08-14 NOTE — Addendum Note (Signed)
Addended by: Jabre Heo C on: 08/14/2015 05:39 PM   Modules accepted: Orders, Level of Service

## 2015-08-20 ENCOUNTER — Other Ambulatory Visit: Payer: Self-pay | Admitting: *Deleted

## 2015-08-20 DIAGNOSIS — B20 Human immunodeficiency virus [HIV] disease: Secondary | ICD-10-CM

## 2015-08-20 MED ORDER — EMTRICITAB-RILPIVIR-TENOFOV AF 200-25-25 MG PO TABS: 1.0000 | ORAL_TABLET | Freq: Every day | ORAL | Status: AC

## 2015-08-20 NOTE — Telephone Encounter (Signed)
Error

## 2015-10-28 ENCOUNTER — Ambulatory Visit: Payer: 59

## 2015-10-30 ENCOUNTER — Telehealth: Payer: Self-pay | Admitting: *Deleted

## 2015-10-30 NOTE — Telephone Encounter (Signed)
Patient left message in Triage stating he was relocating to [place], needed assistance/RW/ADAP. He asked for a return call. RN returned the call, no answer and unable to leave a message due to his voicemail box being full. Andree CossHowell, Rudene Poulsen M, RN

## 2015-11-05 NOTE — Telephone Encounter (Signed)
Patient called back, asked to have his records faxed immediately so he does not run out of medication. RN advised patient that as soon as we receive a signed records release, we will fax his last office note, labs, and immunizations - will place the signed release for the rest of his records in [facility]. Patient will ask his new clinic to send us the release. Andree CossHowell, Verdell Dykman M, RN

## 2015-11-07 NOTE — Telephone Encounter (Signed)
[facility] [place], [place].  Requested records faxed per ROI.

## 2015-11-08 ENCOUNTER — Telehealth: Payer: Self-pay

## 2015-11-08 NOTE — Telephone Encounter (Signed)
Patient left voicemail in triage wanting to verify fax number for clinic. Called back twice but voicemail full. Rejeana Brockandace Damika Harmon, LPN

## 2016-01-08 NOTE — Progress Notes (Signed)
Counselor did not meet with patient   This encounter was created in error - please disregard. 

## 2016-02-17 ENCOUNTER — Ambulatory Visit: Payer: 59 | Admitting: Infectious Diseases
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
